# Patient Record
Sex: Female | Born: 1975 | Race: Black or African American | Hispanic: No | Marital: Married | Smoking: Never smoker
Health system: Southern US, Community
[De-identification: ages and names within clinical notes are randomized; demographics above are authoritative.]

## PROBLEM LIST (undated history)

## (undated) DIAGNOSIS — B019 Varicella without complication: Secondary | ICD-10-CM

## (undated) DIAGNOSIS — Z9989 Dependence on other enabling machines and devices: Principal | ICD-10-CM

## (undated) DIAGNOSIS — R000 Tachycardia, unspecified: Secondary | ICD-10-CM

## (undated) DIAGNOSIS — I509 Heart failure, unspecified: Secondary | ICD-10-CM

## (undated) DIAGNOSIS — F39 Unspecified mood [affective] disorder: Secondary | ICD-10-CM

## (undated) DIAGNOSIS — M797 Fibromyalgia: Secondary | ICD-10-CM

## (undated) DIAGNOSIS — E119 Type 2 diabetes mellitus without complications: Secondary | ICD-10-CM

## (undated) DIAGNOSIS — E282 Polycystic ovarian syndrome: Secondary | ICD-10-CM

## (undated) DIAGNOSIS — G4733 Obstructive sleep apnea (adult) (pediatric): Secondary | ICD-10-CM

## (undated) DIAGNOSIS — E039 Hypothyroidism, unspecified: Secondary | ICD-10-CM

## (undated) DIAGNOSIS — F419 Anxiety disorder, unspecified: Secondary | ICD-10-CM

## (undated) HISTORY — DX: Obstructive sleep apnea (adult) (pediatric): G47.33

## (undated) HISTORY — DX: Type 2 diabetes mellitus without complications: E11.9

## (undated) HISTORY — DX: Dependence on other enabling machines and devices: Z99.89

## (undated) HISTORY — DX: Varicella without complication: B01.9

## (undated) HISTORY — DX: Fibromyalgia: M79.7

## (undated) HISTORY — DX: Unspecified mood (affective) disorder: F39

## (undated) HISTORY — DX: Polycystic ovarian syndrome: E28.2

## (undated) HISTORY — DX: Tachycardia, unspecified: R00.0

## (undated) HISTORY — PX: BREAST REDUCTION SURGERY: SHX8

## (undated) HISTORY — PX: WISDOM TOOTH EXTRACTION: SHX21

---

## 1999-08-22 ENCOUNTER — Encounter: Admission: RE | Admit: 1999-08-22 | Discharge: 1999-08-22 | Payer: Self-pay | Admitting: Family Medicine

## 1999-08-22 ENCOUNTER — Encounter: Payer: Self-pay | Admitting: Family Medicine

## 2001-01-06 ENCOUNTER — Encounter: Payer: Self-pay | Admitting: Family Medicine

## 2001-01-06 ENCOUNTER — Encounter: Admission: RE | Admit: 2001-01-06 | Discharge: 2001-01-06 | Payer: Self-pay | Admitting: Family Medicine

## 2003-07-18 ENCOUNTER — Emergency Department (HOSPITAL_COMMUNITY): Admission: EM | Admit: 2003-07-18 | Discharge: 2003-07-18 | Payer: Self-pay | Admitting: Emergency Medicine

## 2004-10-07 ENCOUNTER — Encounter: Admission: RE | Admit: 2004-10-07 | Discharge: 2004-10-07 | Payer: Self-pay | Admitting: Neurology

## 2011-12-23 ENCOUNTER — Encounter: Payer: Self-pay | Admitting: Family Medicine

## 2011-12-23 ENCOUNTER — Ambulatory Visit (INDEPENDENT_AMBULATORY_CARE_PROVIDER_SITE_OTHER): Payer: 59 | Admitting: Family Medicine

## 2011-12-23 VITALS — BP 125/75 | HR 95 | Temp 98.1°F | Ht 63.75 in | Wt 202.0 lb

## 2011-12-23 DIAGNOSIS — R5383 Other fatigue: Secondary | ICD-10-CM

## 2011-12-23 DIAGNOSIS — E282 Polycystic ovarian syndrome: Secondary | ICD-10-CM

## 2011-12-23 DIAGNOSIS — R5381 Other malaise: Secondary | ICD-10-CM

## 2011-12-23 DIAGNOSIS — H6980 Other specified disorders of Eustachian tube, unspecified ear: Secondary | ICD-10-CM | POA: Insufficient documentation

## 2011-12-23 LAB — CBC WITH DIFFERENTIAL/PLATELET
Basophils Absolute: 0 10*3/uL (ref 0.0–0.1)
Basophils Relative: 0.7 % (ref 0.0–3.0)
Eosinophils Absolute: 0.1 10*3/uL (ref 0.0–0.7)
Eosinophils Relative: 1.1 % (ref 0.0–5.0)
HCT: 46.5 % — ABNORMAL HIGH (ref 36.0–46.0)
Hemoglobin: 15.7 g/dL — ABNORMAL HIGH (ref 12.0–15.0)
Lymphocytes Relative: 40.8 % (ref 12.0–46.0)
Lymphs Abs: 2.4 10*3/uL (ref 0.7–4.0)
MCHC: 33.8 g/dL (ref 30.0–36.0)
MCV: 88 fl (ref 78.0–100.0)
Monocytes Absolute: 0.5 10*3/uL (ref 0.1–1.0)
Monocytes Relative: 7.7 % (ref 3.0–12.0)
Neutro Abs: 3 10*3/uL (ref 1.4–7.7)
Neutrophils Relative %: 49.7 % (ref 43.0–77.0)
Platelets: 462 10*3/uL — ABNORMAL HIGH (ref 150.0–400.0)
RBC: 5.28 Mil/uL — ABNORMAL HIGH (ref 3.87–5.11)
RDW: 13.4 % (ref 11.5–14.6)
WBC: 6 10*3/uL (ref 4.5–10.5)

## 2011-12-23 LAB — BASIC METABOLIC PANEL
BUN: 11 mg/dL (ref 6–23)
CO2: 20 mEq/L (ref 19–32)
Calcium: 9.4 mg/dL (ref 8.4–10.5)
Chloride: 102 mEq/L (ref 96–112)
Creatinine, Ser: 0.4 mg/dL (ref 0.4–1.2)
GFR: 181.72 mL/min (ref >60.00)
Glucose, Bld: 309 mg/dL — ABNORMAL HIGH (ref 70–99)
Potassium: 4.2 mEq/L (ref 3.5–5.1)
Sodium: 133 mEq/L — ABNORMAL LOW (ref 135–145)

## 2011-12-23 LAB — TSH: TSH: 2.53 u[IU]/mL (ref 0.35–5.50)

## 2011-12-23 MED ORDER — FLUTICASONE PROPIONATE 50 MCG/ACT NA SUSP: 2.0000 | Freq: Every day | NASAL | Status: DC

## 2011-12-23 NOTE — Assessment & Plan Note (Signed)
New.  Pt would like thyroid checked to r/o possible abnormality.  Will also r/o anemia and electrolyte disturbance.  Will follow closely.

## 2011-12-23 NOTE — Assessment & Plan Note (Signed)
New.  Pt's ear pain is not due to infxn but rather untreated nasal allergies.  Start nasal steroid spray and encouraged daily OTC antihistamine use.  Pt expressed understanding and is in agreement w/ plan.

## 2011-12-23 NOTE — Patient Instructions (Addendum)
Schedule your complete medical physical in 6 months (Julio Sicks will take care of the GYN stuff) We'll notify you of your lab results and make any changes if needed Start the nasal spray- 2 sprays each nostril daily- to improve your ear pain caused by your nasal allergies Start taking Claritin/Zyrtec/Allegra daily Call with any questions or concerns Welcome!  We're glad to have you!

## 2011-12-23 NOTE — Progress Notes (Signed)
  Subjective:    Patient ID: Lydia Taylor, female    DOB: 1975/10/23, 36 y.o.   MRN: [id number]  HPI New to establish.  Previous PCP- none.  GYN- Julio Sicks, NP ([facility])  PCOS- chronic problem, currently on Metformin.  Following w/ GYN.  Pt has been attributing weight gain, hot flashes to this dx.  Hot flashes, weight gain, fatigue despite adequate sleep (8-9 hrs) but describes sleep as 'restless' due to hot flashes.  Husband concerned about possible thyroid issue.  No family hx of thyroid problems.  Denies palpitations, N/V/D.  Ear pain- R ear, sxs started 'several months ago'.  Will be intermittently painful.  No drainage.  No fevers.  No hx of recurrent ear infections, tubes.  + nasal congestion, PND.  Taking Allegra prn and benadryl for sleep.   Review of Systems For ROS see HPI     Objective:   Physical Exam  Vitals reviewed. Constitutional: She is oriented to person, place, and time. She appears well-developed and well-nourished. No distress.       obese  HENT:  Head: Normocephalic and atraumatic.  Right Ear: Tympanic membrane normal.  Left Ear: Tympanic membrane normal.  Nose: Mucosal edema and rhinorrhea present. Right sinus exhibits no maxillary sinus tenderness and no frontal sinus tenderness. Left sinus exhibits no maxillary sinus tenderness and no frontal sinus tenderness.  Mouth/Throat: Mucous membranes are normal. Posterior oropharyngeal erythema (w/ PND) present.  Eyes: Conjunctivae and EOM are normal. Pupils are equal, round, and reactive to light.  Neck: Normal range of motion. Neck supple. No thyromegaly present.  Cardiovascular: Normal rate, regular rhythm and normal heart sounds.   Pulmonary/Chest: Effort normal and breath sounds normal. No respiratory distress. She has no wheezes. She has no rales.  Musculoskeletal: She exhibits no edema.  Lymphadenopathy:    She has no cervical adenopathy.  Neurological: She is alert and oriented to person,  place, and time. No cranial nerve deficit. Coordination normal.  Skin: Skin is warm and dry.  Psychiatric: She has a normal mood and affect. Her behavior is normal.          Assessment & Plan:

## 2011-12-23 NOTE — Assessment & Plan Note (Signed)
New to provider, chronic for pt.  On Metformin.  Will follow along and assist as able.

## 2011-12-24 LAB — HEMOGLOBIN A1C: Hgb A1c MFr Bld: 10 % — ABNORMAL HIGH (ref 4.6–6.5)

## 2012-01-02 ENCOUNTER — Encounter: Payer: Self-pay | Admitting: *Deleted

## 2012-01-02 MED ORDER — METFORMIN HCL 500 MG PO TABS: 1000.0000 mg | ORAL_TABLET | Freq: Two times a day (BID) | ORAL | Status: DC

## 2012-01-02 NOTE — Addendum Note (Signed)
Addended by: Derry Lory A on: 01/02/2012 04:34 PM   Modules accepted: Orders

## 2012-01-07 ENCOUNTER — Ambulatory Visit: Payer: 59 | Admitting: Family Medicine

## 2012-01-08 ENCOUNTER — Telehealth: Payer: Self-pay | Admitting: Family Medicine

## 2012-01-08 NOTE — Telephone Encounter (Signed)
Message copied by Verner Chol on Wed Jan 08, 2012  1:06 PM ------      Message from: Sheliah Hatch      Created: Tue Jan 07, 2012  9:01 AM      Regarding: RE: CANCELLATION       Absolutely a $50 fee!  She scheduled the appt- she needs to make sure she's here!      ----- Message -----         From: Duaine Dredge         Sent: 01/07/2012   8:29 AM           To: Sheliah Hatch, MD, #      Subject: CANCELLATION                                             Your 9:15am patient just cancelled.  Her reason is "there is just no way I can get there by 9:15am"  Did not want to rsc'd at this time.  If want to charge $50 fee please inform Judeth Cornfield.

## 2013-12-17 DIAGNOSIS — G43909 Migraine, unspecified, not intractable, without status migrainosus: Secondary | ICD-10-CM | POA: Insufficient documentation

## 2014-05-23 DIAGNOSIS — E669 Obesity, unspecified: Secondary | ICD-10-CM | POA: Insufficient documentation

## 2014-05-23 DIAGNOSIS — E1169 Type 2 diabetes mellitus with other specified complication: Secondary | ICD-10-CM | POA: Insufficient documentation

## 2014-05-23 DIAGNOSIS — E66811 Obesity, class 1: Secondary | ICD-10-CM | POA: Insufficient documentation

## 2014-10-24 LAB — HM PAP SMEAR: HM Pap smear: NORMAL

## 2014-12-22 LAB — HM DIABETES EYE EXAM

## 2015-03-02 ENCOUNTER — Ambulatory Visit: Payer: Self-pay | Admitting: Licensed Clinical Social Worker

## 2015-05-22 ENCOUNTER — Ambulatory Visit: Payer: Self-pay | Admitting: Physician Assistant

## 2015-05-31 ENCOUNTER — Telehealth: Payer: Self-pay | Admitting: Physician Assistant

## 2015-05-31 ENCOUNTER — Ambulatory Visit (INDEPENDENT_AMBULATORY_CARE_PROVIDER_SITE_OTHER): Payer: Commercial Managed Care - PPO | Admitting: Physician Assistant

## 2015-05-31 ENCOUNTER — Encounter: Payer: Self-pay | Admitting: Physician Assistant

## 2015-05-31 VITALS — BP 127/94 | HR 93 | Temp 98.2°F | Ht 63.75 in | Wt 187.4 lb

## 2015-05-31 DIAGNOSIS — M791 Myalgia: Secondary | ICD-10-CM | POA: Diagnosis not present

## 2015-05-31 DIAGNOSIS — IMO0001 Reserved for inherently not codable concepts without codable children: Secondary | ICD-10-CM

## 2015-05-31 DIAGNOSIS — M25512 Pain in left shoulder: Secondary | ICD-10-CM | POA: Diagnosis not present

## 2015-05-31 DIAGNOSIS — M609 Myositis, unspecified: Secondary | ICD-10-CM | POA: Diagnosis not present

## 2015-05-31 LAB — CBC
HEMATOCRIT: 46.2 % — AB (ref 36.0–46.0)
Hemoglobin: 15.4 g/dL — ABNORMAL HIGH (ref 12.0–15.0)
MCHC: 33.4 g/dL (ref 30.0–36.0)
MCV: 86.9 fl (ref 78.0–100.0)
Platelets: 473 10*3/uL — ABNORMAL HIGH (ref 150.0–400.0)
RBC: 5.31 Mil/uL — ABNORMAL HIGH (ref 3.87–5.11)
RDW: 13.6 % (ref 11.5–15.5)
WBC: 6.7 10*3/uL (ref 4.0–10.5)

## 2015-05-31 LAB — SEDIMENTATION RATE: SED RATE: 15 mm/h (ref 0–22)

## 2015-05-31 LAB — RHEUMATOID FACTOR: Rhuematoid fact SerPl-aCnc: <10 IU/mL (ref <=14)

## 2015-05-31 MED ORDER — CELECOXIB 100 MG PO CAPS: 100.0000 mg | ORAL_CAPSULE | Freq: Two times a day (BID) | ORAL | Status: DC

## 2015-05-31 NOTE — Patient Instructions (Signed)
Please go to the lab for blood work. I will call with your results. You will be contacted by Dr. Lazaro Arms office for further assessment of your shoulder. Take the Celebrex twice daily with food.  We will alter your regimen based on your results.  Follow-up in 2-3 weeks.

## 2015-05-31 NOTE — Progress Notes (Signed)
Patient presents to clinic today to re-establish care.  Patient is followed by GYN for PCOS and routine gynecological care -- sees [facility]. Is also followed by Endocrinology for DM and Hypothyroidism.  Patient c/o pain in left shoulder over the past several months. Was seen by Chiropractor who felt symptoms were consistent with bursitis. Injections were recommended but patient has previously declined. Denies having any imaging done of her left shoulder. Endorses pain is constant but worsened with abduction of the arm. Denies inability to raise arm but notes pain. Denies numbness or tingling. Denies pain in the neck.  Patient also endorses pain of bilateral knees and hips over the past few months that is constant and worsening. Denies trauma or injury. Denies swelling of joints. Was told before that she may have fibromyalgia but no workup was done to assess for arthritis or inflammatory conditions.  Past Medical History  Diagnosis Date  . PCOS (polycystic ovarian syndrome)   . Diabetes mellitus without complication ([place])   . Chicken pox     Past Surgical History  Procedure Laterality Date  . Breast reduction surgery    . Wisdom tooth extraction      No current outpatient prescriptions on file prior to visit.   No current facility-administered medications on file prior to visit.    Allergies  Allergen Reactions  . Amoxicillin Rash    Family History  Problem Relation Age of Onset  . Thyroid disease Mother   . Diabetes Father   . Hypertension Father   . Hyperlipidemia Father     Social History   Social History  . Marital Status: Married    Spouse Name: N/A  . Number of Children: N/A  . Years of Education: N/A   Occupational History  . Adult nurse    Social History Main Topics  . Smoking status: Never Smoker   . Smokeless tobacco: Never Used  . Alcohol Use: 0.0 oz/week    0 Standard drinks or equivalent per week     Comment: socially  . Drug  Use: No  . Sexual Activity:    Partners: Male   Other Topics Concern  . Not on file   Social History Narrative   Review of Systems  Constitutional: Negative for fever and weight loss.  HENT: Negative for ear discharge, ear pain, hearing loss and tinnitus.   Eyes: Negative for blurred vision, double vision, photophobia and pain.  Respiratory: Negative for cough and shortness of breath.   Cardiovascular: Negative for chest pain and palpitations.  Gastrointestinal: Negative for heartburn, nausea, vomiting, abdominal pain, diarrhea, constipation, blood in stool and melena.  Genitourinary: Negative for dysuria, urgency, frequency, hematuria and flank pain.  Musculoskeletal: Positive for myalgias and joint pain. Negative for falls.  Neurological: Negative for dizziness, loss of consciousness and headaches.  Endo/Heme/Allergies: Negative for environmental allergies.  Psychiatric/Behavioral: Negative for depression, suicidal ideas, hallucinations and substance abuse. The patient is not nervous/anxious and does not have insomnia.    BP 127/94 mmHg  Pulse 93  Temp(Src) 98.2 F (36.8 C) (Oral)  Ht 5' 3.75" (1.619 m)  Wt 187 lb 6.4 oz (85.004 kg)  BMI 32.43 kg/m2  SpO2 99%  LMP 05/27/2015  Physical Exam  Constitutional: She is oriented to person, place, and time and well-developed, well-nourished, and in no distress.  HENT:  Head: Normocephalic and atraumatic.  Eyes: Conjunctivae are normal.  Cardiovascular: Normal rate, regular rhythm, normal heart sounds and intact distal pulses.   Pulmonary/Chest: Effort normal  and breath sounds normal. No respiratory distress. She has no wheezes. She has no rales. She exhibits no tenderness.  Musculoskeletal:       Left shoulder: She exhibits tenderness and pain. She exhibits normal range of motion, no spasm, normal pulse and normal strength.       Right knee: Normal.       Left knee: Normal.       Right lower leg: She exhibits tenderness. She  exhibits no swelling.       Left lower leg: She exhibits tenderness. She exhibits no swelling.  Neurological: She is alert and oriented to person, place, and time.  Skin: Skin is warm and dry. No rash noted.  Psychiatric: Affect normal.  Vitals reviewed.   No results found for this or any previous visit (from the past 2160 hour(s)).  Assessment/Plan: Left shoulder pain AROM and PROM present but with pain. Favor tendinopathy versus bursitis. Will begin Celebrex. Supportive measures reviewed. Referral to Sports Medicine placed.  Myalgia and myositis Unclear etiology. Will check CBC, ESR, ANA and RF today. Will begin low intensity exercise and celebrex. Follow-up 3-4 weeks.

## 2015-05-31 NOTE — Telephone Encounter (Signed)
Pharmacy changed.//AB/CMA 

## 2015-05-31 NOTE — Assessment & Plan Note (Signed)
AROM and PROM present but with pain. Favor tendinopathy versus bursitis. Will begin Celebrex. Supportive measures reviewed. Referral to Sports Medicine placed.

## 2015-05-31 NOTE — Assessment & Plan Note (Signed)
Unclear etiology. Will check CBC, ESR, ANA and RF today. Will begin low intensity exercise and celebrex. Follow-up 3-4 weeks.

## 2015-05-31 NOTE — Progress Notes (Signed)
Pre visit review using our clinic review tool, if applicable. No additional management support is needed unless otherwise documented below in the visit note. 

## 2015-05-31 NOTE — Telephone Encounter (Signed)
Pt would like to change the pharmacy on file to [facility] on [address]

## 2015-06-01 LAB — ANA: Anti Nuclear Antibody(ANA): NEGATIVE

## 2015-06-05 ENCOUNTER — Telehealth: Payer: Self-pay | Admitting: *Deleted

## 2015-06-05 DIAGNOSIS — M25512 Pain in left shoulder: Secondary | ICD-10-CM

## 2015-06-05 NOTE — Telephone Encounter (Signed)
-----   Message from Waldon Merl, PA-C sent at 06/05/2015  7:13 AM EST ----- Labs are unremarkable. No sign of inflammation. Suspect pain is osteoarthritis coupled with some fibromyalgia. Would like to see her as scheduled for follow-up so we can see how the Celebrex is doing and discuss further management.

## 2015-06-05 NOTE — Telephone Encounter (Signed)
Pt was advised that someone would contact her in the next few days with sports medicine referral and she already has f/u on 06/19/15.

## 2015-06-05 NOTE — Telephone Encounter (Signed)
Referral has been placed. Have her schedule follow-up so we can discuss other medication options for chronic msk pain.

## 2015-06-05 NOTE — Telephone Encounter (Signed)
Notified pt and she voices understanding. States we were supposed to have referred her to Sports Medicine. Doesn't really feel like celebrex is helping. Please advise?

## 2015-06-08 ENCOUNTER — Ambulatory Visit: Payer: Commercial Managed Care - PPO | Admitting: Family Medicine

## 2015-06-13 ENCOUNTER — Ambulatory Visit (INDEPENDENT_AMBULATORY_CARE_PROVIDER_SITE_OTHER): Payer: Commercial Managed Care - PPO | Admitting: Family Medicine

## 2015-06-13 ENCOUNTER — Encounter: Payer: Self-pay | Admitting: Family Medicine

## 2015-06-13 VITALS — BP 127/82 | HR 112 | Ht 63.0 in | Wt 187.0 lb

## 2015-06-13 DIAGNOSIS — M25552 Pain in left hip: Secondary | ICD-10-CM

## 2015-06-13 DIAGNOSIS — M25512 Pain in left shoulder: Secondary | ICD-10-CM | POA: Diagnosis not present

## 2015-06-13 MED ORDER — METHYLPREDNISOLONE ACETATE 40 MG/ML IJ SUSP
40.0000 mg | Freq: Once | INTRAMUSCULAR | Status: AC
  Administered 2015-06-13: 40 mg via INTRA_ARTICULAR

## 2015-06-13 NOTE — Patient Instructions (Signed)
You have a frozen shoulder (adhesive capsulitis), a buildup of scar tissue that limits motion of the shoulder joint. Limit lifting and overhead activities as much as possible. Heat 15 minutes at a time 3-4 times a day may help with movement and stiffness. Tylenol and/or aleve as needed for pain and inflammation. Steroid injections in a series have been shown to help with pain and motion - you were given this today. Codman exercises (pendulum, table slides, arm circles) - do 3 sets of 10 once or twice a day. Physical therapy for rotator cuff strengthening is a consideration once you are out of the painful phase You can start the theraband exercises  Follow up in 6 weeks  You have trochanteric bursitis. Avoid painful activities as much as possible. Ice over area of pain 3-4 times a day for 15 minutes at a time Hip side raise exercise 3 sets of 10-15 once a day - add weights if this becomes too easy. Stretches - pick 2 and hold for 20-30 seconds x 3 - do once or twice a day. Tylenol and/or aleve as needed for pain. If not improving, can consider physical therapy. You were given a cortisone shot today.

## 2015-06-14 DIAGNOSIS — M25552 Pain in left hip: Secondary | ICD-10-CM | POA: Insufficient documentation

## 2015-06-14 NOTE — Assessment & Plan Note (Signed)
Left trochanteric bursitis - Shown home exercises and stretches to do daily.  Injection given as well.  Tylenol, nsaids if needed.  Icing.  F/u in 6 weeks.  Consider physical therapy if not improving.  After informed written consent patient was lying on right side on exam table.  Area overlying left trochanteric bursa prepped with alcohol swab then injected with 6:2 marcaine: depomedrol.  Patient tolerated procedure well without immediate complications.

## 2015-06-14 NOTE — Progress Notes (Signed)
PCP and consultation requested by: Piedad Climes, PA-C  Subjective:   HPI: Patient is a 40 y.o. female here for left shoulder, left hip pain.  Patient reports for 3 1/2 months she's had lateral left shoulder pain. Some right shoulder pain too but left is worse at 8/10 level, sharp. Worse lying on left side, reaching overhead. Tried celebrex without much help. Left hip also bothering for 3 1/2 months. No injury or trauma. Pain level 0/10 currently. Worse lying on her side. No skin changes, fever, other complaints.  Past Medical History  Diagnosis Date  . PCOS (polycystic ovarian syndrome)   . Diabetes mellitus without complication ([facility])   . Chicken pox     Current Outpatient Prescriptions on File Prior to Visit  Medication Sig Dispense Refill  . canagliflozin (INVOKANA) 100 MG TABS tablet Take 1 tablet by mouth daily.    . celecoxib (CELEBREX) 100 MG capsule Take 1 capsule (100 mg total) by mouth 2 (two) times daily. 60 capsule 1  . diphenhydrAMINE (SOMINEX) 25 MG tablet Take 25 mg by mouth as needed.    . Dulaglutide (TRULICITY) 1.5 MG/0.5ML SOPN Inject 1.5 mg into the skin once a week.    Marland Kitchen glucose blood (FREESTYLE INSULINX TEST) test strip Use as directed    . levothyroxine (SYNTHROID, LEVOTHROID) 25 MCG tablet Take 25 mcg by mouth daily before breakfast.    . metFORMIN (GLUCOPHAGE) 1000 MG tablet Take 1 tablet by mouth 2 (two) times daily.    . Multiple Vitamins-Minerals (THERA-M) TABS Take 1 tablet by mouth daily.    . norethindrone (MICRONOR,CAMILA,ERRIN) 0.35 MG tablet Take 1 tablet by mouth daily.    . sertraline (ZOLOFT) 50 MG tablet Take 50 mg by mouth daily.    . Topiramate ER (TROKENDI XR) 100 MG CP24 Take 100 mg by mouth daily.     No current facility-administered medications on file prior to visit.    Past Surgical History  Procedure Laterality Date  . Breast reduction surgery    . Wisdom tooth extraction      Allergies  Allergen Reactions  .  Amoxicillin Rash    Social History   Social History  . Marital Status: Married    Spouse Name: N/A  . Number of Children: N/A  . Years of Education: N/A   Occupational History  . Theatre manager    Social History Main Topics  . Smoking status: Never Smoker   . Smokeless tobacco: Never Used  . Alcohol Use: 0.0 oz/week    0 Standard drinks or equivalent per week     Comment: socially  . Drug Use: No  . Sexual Activity:    Partners: Male   Other Topics Concern  . Not on file   Social History Narrative    Family History  Problem Relation Age of Onset  . Thyroid disease Mother   . Diabetes Father   . Hypertension Father   . Hyperlipidemia Father     BP 127/82 mmHg  Pulse 112  Ht 5\' 3"  (1.6 m)  Wt 187 lb (84.823 kg)  BMI 33.13 kg/m2  LMP 05/27/2015  Review of Systems: See HPI above.    Objective:  Physical Exam:  Gen: NAD  Left shoulder: No swelling, ecchymoses.  No gross deformity. No TTP. ER 30 degrees.  Abduction and flexion to 90 degrees. Positive Hawkins, Neers. Negative Yergasons. Strength 5/5 with empty can and resisted internal/external rotation. NV intact distally.  Right shoulder: FROM without pain.  Left  hip: No swelling, bruising, other complaints. Hip abduction 4/5 with pain. TTP trochanteric bursa only.  No other tenderness. Negative logroll, fabers, piriformis stretches.    Assessment & Plan:  1. 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  Discussed options.  Shown home exercises to do daily.  Heat, tylenol/nsaids.  Combination injection given today.  F/u in 6 weeks.  After informed written consent, patient was seated on exam table. Left shoulder was prepped with alcohol swab and utilizing posterior approach, patient's left shoulder was injected with 6:2 marcaine:depomedrol with half in the subacromial space and half in glenohumeral space.  Patient tolerated the procedure well without immediate complications.  2. Left trochanteric  bursitis - Shown home exercises and stretches to do daily.  Injection given as well.  Tylenol, nsaids if needed.  Icing.  F/u in 6 weeks.  Consider physical therapy if not improving.  After informed written consent patient was lying on right side on exam table.  Area overlying left trochanteric bursa prepped with alcohol swab then injected with 6:2 marcaine: depomedrol.  Patient tolerated procedure well without immediate complications.

## 2015-06-14 NOTE — Assessment & Plan Note (Signed)
2/2 frozen shoulder.  Discussed options.  Shown home exercises to do daily.  Heat, tylenol/nsaids.  Combination injection given today.  F/u in 6 weeks.  After informed written consent, patient was seated on exam table. Left shoulder was prepped with alcohol swab and utilizing posterior approach, patient's left shoulder was injected with 6:2 marcaine:depomedrol with half in the subacromial space and half in glenohumeral space.  Patient tolerated the procedure well without immediate complications.

## 2015-06-19 ENCOUNTER — Ambulatory Visit (INDEPENDENT_AMBULATORY_CARE_PROVIDER_SITE_OTHER): Payer: Commercial Managed Care - PPO | Admitting: Physician Assistant

## 2015-06-19 ENCOUNTER — Encounter: Payer: Self-pay | Admitting: Physician Assistant

## 2015-06-19 VITALS — BP 121/92 | HR 111 | Temp 98.1°F | Ht 63.0 in | Wt 180.6 lb

## 2015-06-19 DIAGNOSIS — M25512 Pain in left shoulder: Secondary | ICD-10-CM | POA: Diagnosis not present

## 2015-06-19 DIAGNOSIS — E039 Hypothyroidism, unspecified: Secondary | ICD-10-CM

## 2015-06-19 LAB — TSH: TSH: 2.39 u[IU]/mL (ref 0.35–4.50)

## 2015-06-19 MED ORDER — HYDROCODONE-ACETAMINOPHEN 10-325 MG PO TABS: 1.0000 | ORAL_TABLET | Freq: Three times a day (TID) | ORAL | Status: DC | PRN

## 2015-06-19 NOTE — Assessment & Plan Note (Signed)
Will stop Celebrex. Rx hydrocodone to use as directed. Follow-up with Sports Medicine and PT as scheduled.

## 2015-06-19 NOTE — Assessment & Plan Note (Signed)
Tachycardic today. Endorses taking medications as directed. Will check TSH today.

## 2015-06-19 NOTE — Progress Notes (Signed)
Pre visit review using our clinic review tool, if applicable. No additional management support is needed unless otherwise documented below in the visit note. 

## 2015-06-19 NOTE — Patient Instructions (Signed)
Please take pain medication as directed. Schedule a follow-up appointment with Dr. Pearletha Forge for your shoulder. If pain is not controlled enough with medication to allow sleep, please let me know so we can get a medicine started short-term for sleep.  Please go to the lab so we can reassess your thyroid function.

## 2015-06-19 NOTE — Progress Notes (Signed)
Patient presents to clinic today to follow-up on myalgias and arthralgias. Has seen Sports Medicine and was diagnosed with trochanteric bursitis and frozen shoulder (left). Was giving steroid injections. Endorses hip pain is completely resolved. Still having bilateral shoulder pain. Denies improvement with Celebrex. Previous lab workup unremarkable. Pain is affecting sleep. Patient has follow-up scheduled with Sports Medicine for second injection and PT.   Past Medical History  Diagnosis Date  . PCOS (polycystic ovarian syndrome)   . Diabetes mellitus without complication ([place])   . Chicken pox     Current Outpatient Prescriptions on File Prior to Visit  Medication Sig Dispense Refill  . canagliflozin (INVOKANA) 100 MG TABS tablet Take 1 tablet by mouth daily.    . diphenhydrAMINE (SOMINEX) 25 MG tablet Take 25 mg by mouth as needed.    . Dulaglutide (TRULICITY) 1.5 KM/6.2MM SOPN Inject 1.5 mg into the skin once a week.    Marland Kitchen glucose blood (FREESTYLE INSULINX TEST) test strip Use as directed    . levothyroxine (SYNTHROID, LEVOTHROID) 25 MCG tablet Take 25 mcg by mouth daily before breakfast.    . metFORMIN (GLUCOPHAGE) 1000 MG tablet Take 1 tablet by mouth 2 (two) times daily.    . Multiple Vitamins-Minerals (THERA-M) TABS Take 1 tablet by mouth daily.    . norethindrone (MICRONOR,CAMILA,ERRIN) 0.35 MG tablet Take 1 tablet by mouth daily.    . sertraline (ZOLOFT) 50 MG tablet Take 50 mg by mouth daily.    . Topiramate ER (TROKENDI XR) 100 MG CP24 Take 100 mg by mouth daily.     No current facility-administered medications on file prior to visit.    Allergies  Allergen Reactions  . Amoxicillin Rash    Family History  Problem Relation Age of Onset  . Thyroid disease Mother   . Diabetes Father   . Hypertension Father   . Hyperlipidemia Father     Social History   Social History  . Marital Status: Married    Spouse Name: N/A  . Number of Children: N/A  . Years of Education:  N/A   Occupational History  . Adult nurse    Social History Main Topics  . Smoking status: Never Smoker   . Smokeless tobacco: Never Used  . Alcohol Use: 0.0 oz/week    0 Standard drinks or equivalent per week     Comment: socially  . Drug Use: No  . Sexual Activity:    Partners: Male   Other Topics Concern  . None   Social History Narrative   Review of Systems - See HPI.  All other ROS are negative.  BP 121/92 mmHg  Pulse 111  Temp(Src) 98.1 F (36.7 C) (Oral)  Ht _0  (1.6 m)  Wt 180 lb 9.6 oz (81.92 kg)  BMI 32.00 kg/m2  SpO2 100%  LMP 05/27/2015  Physical Exam  Constitutional: She is oriented to person, place, and time and well-developed, well-nourished, and in no distress.  HENT:  Head: Normocephalic and atraumatic.  Eyes: Conjunctivae are normal.  Cardiovascular: Regular rhythm, normal heart sounds and intact distal pulses.   Mildly tachycardic  Pulmonary/Chest: Effort normal and breath sounds normal. No respiratory distress. She has no wheezes. She has no rales. She exhibits no tenderness.  Neurological: She is alert and oriented to person, place, and time.  Skin: Skin is warm and dry. No rash noted.  Vitals reviewed.   Recent Results (from the past 2160 hour(s))  CBC     Status: Abnormal  Collection Time: 05/31/15  9:33 AM  Result Value Ref Range   WBC 6.7 4.0 - 10.5 K/uL   RBC 5.31 (H) 3.87 - 5.11 Mil/uL   Platelets 473.0 (H) 150.0 - 400.0 K/uL   Hemoglobin 15.4 (H) 12.0 - 15.0 g/dL   HCT 46.2 (H) 36.0 - 46.0 %   MCV 86.9 78.0 - 100.0 fl   MCHC 33.4 30.0 - 36.0 g/dL   RDW 13.6 11.5 - 15.5 %  Sed Rate (ESR)     Status: None   Collection Time: 05/31/15  9:33 AM  Result Value Ref Range   Sed Rate 15 0 - 22 mm/hr  Rheumatoid Factor     Status: None   Collection Time: 05/31/15  9:33 AM  Result Value Ref Range   Rhuematoid fact SerPl-aCnc <10 <=14 IU/mL    Comment:                            Interpretive Table                     Low  Positive: 15 - 41 IU/mL                     High Positive:  >= 42 IU/mL    In addition to the RF result, and clinical symptoms including joint  involvement, the 2010 ACR Classification Criteria for  scoring/diagnosing Rheumatoid Arthritis include the results of the  following tests:  CRP (09106), ESR (15010), and CCP (APCA) (81661).  www.rheumatology.org/practice/clinical/classification/ra/ra_2010.asp   Antinuclear Antib (ANA)     Status: None   Collection Time: 05/31/15  9:33 AM  Result Value Ref Range   Anit Nuclear Antibody(ANA) NEG NEGATIVE    Assessment/Plan: Left shoulder pain Will stop Celebrex. Rx hydrocodone to use as directed. Follow-up with Sports Medicine and PT as scheduled.   Thyroid activity decreased Tachycardic today. Endorses taking medications as directed. Will check TSH today.

## 2015-06-20 ENCOUNTER — Encounter: Payer: Self-pay | Admitting: *Deleted

## 2015-06-28 ENCOUNTER — Encounter: Payer: Self-pay | Admitting: Physician Assistant

## 2015-06-28 NOTE — Telephone Encounter (Signed)
LMOM with contact name and number [for return call if needed] RE: FMLA paperwork has been located in outgoing faxed papers, as I mistakenly thought that the paperwork had been faxed by provider; I apologized for the miscommunication and informed patient that if there were any further questions and/or concerns to please call or have her employer call/SLS 02/15 Copy to My Chart Message.

## 2015-07-25 ENCOUNTER — Ambulatory Visit: Payer: Commercial Managed Care - PPO | Admitting: Family Medicine

## 2015-08-08 ENCOUNTER — Encounter: Payer: Self-pay | Admitting: Family Medicine

## 2015-08-08 ENCOUNTER — Ambulatory Visit (INDEPENDENT_AMBULATORY_CARE_PROVIDER_SITE_OTHER): Payer: Commercial Managed Care - PPO | Admitting: Family Medicine

## 2015-08-08 VITALS — BP 117/84 | HR 96 | Ht 63.0 in | Wt 184.0 lb

## 2015-08-08 DIAGNOSIS — M25512 Pain in left shoulder: Secondary | ICD-10-CM | POA: Diagnosis not present

## 2015-08-08 MED ORDER — METHYLPREDNISOLONE ACETATE 40 MG/ML IJ SUSP
40.0000 mg | Freq: Once | INTRAMUSCULAR | Status: AC
  Administered 2015-08-08: 40 mg via INTRA_ARTICULAR

## 2015-08-08 NOTE — Patient Instructions (Signed)
Continue with your home exercises. We repeated a cortisone shot today - we could do a third one if necessary in 4-6 weeks. Let me know if you want to do physical therapy. Follow up with me in 4-6 weeks if you want to do another injection. Limit lifting and overhead activities as much as possible. Heat 15 minutes at a time 3-4 times a day may help with movement and stiffness. Tylenol and/or aleve as needed for pain and inflammation.

## 2015-08-09 NOTE — Assessment & Plan Note (Signed)
2/2 frozen shoulder.  Discussed options - went ahead with repeat intraarticular injection today.  Heat, tylenol/nsaids.  Continue with home exercises.  Consider physical therapy, third intraarticular injection if not improving.  F/u in 4-6 weeks.  After informed written consent, patient was seated on exam table. Left shoulder was prepped with alcohol swab and utilizing posterior approach, patient's left glenohumeral space was injected with 3:1 marcaine: depomedrol. Patient tolerated the procedure well without immediate complications.

## 2015-08-09 NOTE — Progress Notes (Signed)
PCP and consultation requested by: Lydia Climes, PA-C  Subjective:   HPI: Patient is a 40 y.o. female here for left shoulder, left hip pain.  1/31: Patient reports for 3 1/2 months she's had lateral left shoulder pain. Some right shoulder pain too but left is worse at 8/10 level, sharp. Worse lying on left side, reaching overhead. Tried celebrex without much help. Left hip also bothering for 3 1/2 months. No injury or trauma. Pain level 0/10 currently. Worse lying on her side. No skin changes, fever, other complaints.  3/28: Patient reports her hip is completely better following injection. Left shoulder still bothersome, difficulty washing hair and sleeping. Wakes up at night. Pain up to 7/10 and sharp deep shoulder. No skin changes, fever, other complaints.  Past Medical History  Diagnosis Date  . PCOS (polycystic ovarian syndrome)   . Diabetes mellitus without complication (HCC)   . Chicken pox     Current Outpatient Prescriptions on File Prior to Visit  Medication Sig Dispense Refill  . canagliflozin (INVOKANA) 100 MG TABS tablet Take 1 tablet by mouth daily.    . diphenhydrAMINE (SOMINEX) 25 MG tablet Take 25 mg by mouth as needed.    . Dulaglutide (TRULICITY) 1.5 MG/0.5ML SOPN Inject 1.5 mg into the skin once a week.    Marland Kitchen glucose blood (FREESTYLE INSULINX TEST) test strip Use as directed    . HYDROcodone-acetaminophen (NORCO) 10-325 MG tablet Take 1 tablet by mouth every 8 (eight) hours as needed. 90 tablet 0  . levothyroxine (SYNTHROID, LEVOTHROID) 25 MCG tablet Take 25 mcg by mouth daily before breakfast.    . metFORMIN (GLUCOPHAGE) 1000 MG tablet Take 1 tablet by mouth 2 (two) times daily.    . Multiple Vitamins-Minerals (THERA-M) TABS Take 1 tablet by mouth daily.    . norethindrone (MICRONOR,CAMILA,ERRIN) 0.35 MG tablet Take 1 tablet by mouth daily.    . sertraline (ZOLOFT) 50 MG tablet Take 50 mg by mouth daily.    . Topiramate ER (TROKENDI XR) 100 MG  CP24 Take 100 mg by mouth daily.     No current facility-administered medications on file prior to visit.    Past Surgical History  Procedure Laterality Date  . Breast reduction surgery    . Wisdom tooth extraction      Allergies  Allergen Reactions  . Amoxicillin Rash    Social History   Social History  . Marital Status: Married    Spouse Name: N/A  . Number of Children: N/A  . Years of Education: N/A   Occupational History  . Theatre manager    Social History Main Topics  . Smoking status: Never Smoker   . Smokeless tobacco: Never Used  . Alcohol Use: 0.0 oz/week    0 Standard drinks or equivalent per week     Comment: socially  . Drug Use: No  . Sexual Activity:    Partners: Male   Other Topics Concern  . Not on file   Social History Narrative    Family History  Problem Relation Age of Onset  . Thyroid disease Mother   . Diabetes Father   . Hypertension Father   . Hyperlipidemia Father     BP 117/84 mmHg  Pulse 96  Ht 5\' 3"  (1.6 m)  Wt 184 lb (83.462 kg)  BMI 32.60 kg/m2  Review of Systems: See HPI above.    Objective:  Physical Exam:  Gen: NAD  Left shoulder: No swelling, ecchymoses.  No gross deformity. No TTP.  ER 45 degrees.  Abduction and flexion to 90 degrees. Positive Hawkins, Neers. Negative Yergasons. Strength 5/5 with empty can and resisted internal/external rotation.  No pain on resisted motions. NV intact distally.  Right shoulder: FROM without pain.    Assessment & Plan:  1. 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  Discussed options - went ahead with repeat intraarticular injection today.  Heat, tylenol/nsaids.  Continue with home exercises.  Consider physical therapy, third intraarticular injection if not improving.  F/u in 4-6 weeks.  After informed written consent, patient was seated on exam table. Left shoulder was prepped with alcohol swab and utilizing posterior approach, patient's left glenohumeral space was  injected with 3:1 marcaine: depomedrol. Patient tolerated the procedure well without immediate complications.

## 2016-02-26 ENCOUNTER — Encounter: Payer: Self-pay | Admitting: Physician Assistant

## 2016-02-26 ENCOUNTER — Other Ambulatory Visit: Payer: Self-pay | Admitting: Physician Assistant

## 2016-02-26 NOTE — Telephone Encounter (Signed)
Requesting: Hydrocodone Contract  None UDS  None Last OV  06/19/2015 Last Refill  #90 with 0 refills on 06/19/2015  Please Advise

## 2016-02-27 NOTE — Telephone Encounter (Signed)
Will refer to PCP. Thanks.

## 2016-03-04 ENCOUNTER — Encounter: Payer: Self-pay | Admitting: Physician Assistant

## 2016-03-04 ENCOUNTER — Ambulatory Visit (INDEPENDENT_AMBULATORY_CARE_PROVIDER_SITE_OTHER): Payer: Commercial Managed Care - PPO | Admitting: Physician Assistant

## 2016-03-04 VITALS — BP 124/87 | HR 103 | Temp 97.9°F | Resp 16 | Ht 64.0 in | Wt 188.1 lb

## 2016-03-04 DIAGNOSIS — M791 Myalgia, unspecified site: Secondary | ICD-10-CM

## 2016-03-04 DIAGNOSIS — M2550 Pain in unspecified joint: Secondary | ICD-10-CM | POA: Diagnosis not present

## 2016-03-04 MED ORDER — HYDROCODONE-ACETAMINOPHEN 10-325 MG PO TABS: 1.0000 | ORAL_TABLET | ORAL | 0 refills | Status: DC | PRN

## 2016-03-04 NOTE — Progress Notes (Signed)
Patient presents to clinic today to discuss referral to Rheumatology. Patient with chronic bilateral shoulder and hip pain, followed by Sports Medicine. Has had imaging revealing arthritic changes. Multiple injections recently with only slight improvement of symptoms. Is wanting Rheumatology assessment for polymyalgia rheumatica or other autoimmune cause of symptoms.  Is taking Cymbalta 20 mg as directed with improvement in mood and pain.    Past Medical History:  Diagnosis Date  . Chicken pox   . Diabetes mellitus without complication (HCC)   . PCOS (polycystic ovarian syndrome)     Current Outpatient Prescriptions on File Prior to Visit  Medication Sig Dispense Refill  . diphenhydrAMINE (SOMINEX) 25 MG tablet Take 25 mg by mouth as needed.    . Dulaglutide (TRULICITY) 1.5 MG/0.5ML SOPN Inject 1.5 mg into the skin once a week.    Marland Kitchen glucose blood (FREESTYLE INSULINX TEST) test strip Use as directed    . HYDROcodone-acetaminophen (NORCO) 10-325 MG tablet Take 1 tablet by mouth every 8 (eight) hours as needed. 90 tablet 0  . levothyroxine (SYNTHROID, LEVOTHROID) 25 MCG tablet Take 25 mcg by mouth daily before breakfast.    . metFORMIN (GLUCOPHAGE) 1000 MG tablet Take 1 tablet by mouth 2 (two) times daily.    . Multiple Vitamins-Minerals (THERA-M) TABS Take 1 tablet by mouth daily.    . norethindrone (MICRONOR,CAMILA,ERRIN) 0.35 MG tablet Take 1 tablet by mouth daily.    . sertraline (ZOLOFT) 50 MG tablet Take 50 mg by mouth daily.    . Topiramate ER (TROKENDI XR) 100 MG CP24 Take 100 mg by mouth daily.     No current facility-administered medications on file prior to visit.     Allergies  Allergen Reactions  . Amoxicillin Rash    Family History  Problem Relation Age of Onset  . Thyroid disease Mother   . Diabetes Father   . Hypertension Father   . Hyperlipidemia Father     Social History   Social History  . Marital status: Married    Spouse name: N/A  . Number of children:  N/A  . Years of education: N/A   Occupational History  . Theatre manager    Social History Main Topics  . Smoking status: Never Smoker  . Smokeless tobacco: Never Used  . Alcohol use 0.0 oz/week     Comment: socially  . Drug use: No  . Sexual activity: Yes    Partners: Male   Other Topics Concern  . None   Social History Narrative  . None   Review of Systems - See HPI.  All other ROS are negative.  Ht 5\' 4"  (1.626 m)   Wt 188 lb 2 oz (85.3 kg)   LMP 02/12/2016   BMI 32.29 kg/m   Physical Exam  Constitutional: She is oriented to person, place, and time and well-developed, well-nourished, and in no distress.  HENT:  Head: Normocephalic and atraumatic.  Eyes: Conjunctivae are normal.  Cardiovascular: Normal rate, regular rhythm, normal heart sounds and intact distal pulses.   Pulmonary/Chest: Effort normal and breath sounds normal. No respiratory distress. She has no wheezes. She has no rales. She exhibits no tenderness.  Neurological: She is alert and oriented to person, place, and time.  Skin: Skin is warm and dry. No rash noted.  Vitals reviewed.   No results found for this or any previous visit (from the past 2160 hour(s)).  Assessment/Plan: 1. Myalgia Pain medication refilled. Referral placed to Rheumatology. Patient is to verify dosage of Cymbalta  so we can increase. FU discussed. HYDROcodone-acetaminophen (NORCO) 10-325 MG tablet; Take 1 tablet by mouth every 4 (four) hours as needed.  Dispense: 30 tablet; Refill: 0 - Ambulatory referral to Rheumatology    Piedad ClimesMartin, Rivan Siordia Cody, PA-C

## 2016-03-04 NOTE — Patient Instructions (Signed)
Please keep your phone on. You will be contacted for assessment by rheumatology.  Please go home and verify dose of Cymbalta so I can increase further for you. This will help with both mood and pain. Continue staying active. Make sure to use hydrocodone sparingly. Tylenol for mild pain.

## 2016-03-05 ENCOUNTER — Encounter: Payer: Self-pay | Admitting: Physician Assistant

## 2016-03-05 MED ORDER — DULOXETINE HCL 60 MG PO CPEP: 60.0000 mg | ORAL_CAPSULE | Freq: Every day | ORAL | 3 refills | Status: DC

## 2016-03-22 ENCOUNTER — Encounter: Payer: Self-pay | Admitting: Physician Assistant

## 2016-03-22 DIAGNOSIS — R0681 Apnea, not elsewhere classified: Secondary | ICD-10-CM

## 2016-05-21 DIAGNOSIS — M797 Fibromyalgia: Secondary | ICD-10-CM | POA: Insufficient documentation

## 2016-05-27 ENCOUNTER — Encounter: Payer: Self-pay | Admitting: Endocrinology

## 2016-05-27 ENCOUNTER — Ambulatory Visit (INDEPENDENT_AMBULATORY_CARE_PROVIDER_SITE_OTHER): Payer: Commercial Managed Care - PPO | Admitting: Endocrinology

## 2016-05-27 VITALS — BP 128/94 | HR 106 | Ht 64.0 in | Wt 187.0 lb

## 2016-05-27 DIAGNOSIS — E782 Mixed hyperlipidemia: Secondary | ICD-10-CM

## 2016-05-27 DIAGNOSIS — E038 Other specified hypothyroidism: Secondary | ICD-10-CM

## 2016-05-27 DIAGNOSIS — E1165 Type 2 diabetes mellitus with hyperglycemia: Secondary | ICD-10-CM

## 2016-05-27 DIAGNOSIS — E063 Autoimmune thyroiditis: Secondary | ICD-10-CM

## 2016-05-27 DIAGNOSIS — E282 Polycystic ovarian syndrome: Secondary | ICD-10-CM

## 2016-05-27 LAB — POCT GLYCOSYLATED HEMOGLOBIN (HGB A1C): Hemoglobin A1C: 8

## 2016-05-27 MED ORDER — METFORMIN HCL ER 500 MG PO TB24: 2000.0000 mg | ORAL_TABLET | Freq: Every day | ORAL | 0 refills | Status: DC

## 2016-05-27 MED ORDER — GLIMEPIRIDE 1 MG PO TABS: 1.0000 mg | ORAL_TABLET | Freq: Every day | ORAL | 0 refills | Status: DC

## 2016-05-27 NOTE — Addendum Note (Signed)
Addended by: Bobbye Riggs on: 05/27/2016 02:37 PM   Modules accepted: Orders

## 2016-05-27 NOTE — Progress Notes (Signed)
Patient ID: Lydia Taylor, female   DOB: Jul 21, 1975, 41 y.o.   MRN: [id number]           Reason for Appointment: Consultation for Type 2 Diabetes  Referring physician: None   History of Present Illness:          Date of diagnosis of type 2 diabetes mellitus: ?  2012        Background history:    She had been diagnosed to have PCOS at age 86 and was on low-dose metformin since then. She was diagnosed to have diabetes when her gynecologist found her to have glycosuria Initially treated with higher doses of metformin but not clear what other medications she had taken Also about 4 years ago she started exercising vigorously and was able to lose 40 pounds and control her blood sugars well  About 3 years ago because of difficulty with exercising her blood sugars were higher and she was started on Invokana 100 mg Subsequently Trulicity was added  She thinks that her A1c has been usually over 7%, no detailed records are available  Recent history:       Non-insulin hypoglycemic drugs the patient is taking are: Metformin 2 g at dinner, Invokana 300 mg daily, Trulicity 1.5 mg weekly  Current management, blood sugar patterns and problems identified:  She checks her blood sugars irregularly and mostly in the mornings as outlined below.  She is accurate some difficulty with exercising because of fibromyalgia but is trying to do more walking with shorter duration but more frequency.  She has not been able to follow her diet well partly because of eating out frequently and not cooking at home.  She does not usually make low-fat choices  Also tends to have more sweets.  Recently also started having snacks with nuts  Her weight has been about the same in the last few months           Side effects from medications have been: Nausea from metformin in the morning  Compliance with the medical regimen: Inconsistent Hypoglycemia:   never  Glucose monitoring:  done 0-1 times a day          Glucometer:  FreeStyle .      Blood Glucose readings   Range by recall: Am sugars: 103-170   Self-care: The diet that the patient has been following is: None .     Meal times are:  Breakfast is at 8 AM Lunch: 2 PM Typical meal intake: Breakfast is Biscuits; eating out regularly, snacks as above               Dietician visit, most recent: 2008               Exercise:  walking up to 30 minutes a day  Weight history:   Wt Readings from Last 3 Encounters:  05/27/16 187 lb (84.8 kg)  03/04/16 188 lb 2 oz (85.3 kg)  08/08/15 184 lb (83.5 kg)    Glycemic control:  7.5   Lab Results  Component Value Date   HGBA1C 10.0 (H) 12/23/2011   Lab Results  Component Value Date   CREATININE 0.4 12/23/2011   No results found for: MICRALBCREAT     Allergies as of 05/27/2016      Reactions   Amoxicillin Rash      Medication List       Accurate as of 05/27/16 12:50 PM. Always use your most recent med list.  diphenhydrAMINE 25 MG tablet Commonly known as:  SOMINEX Take 25 mg by mouth as needed.   DULoxetine 60 MG capsule Commonly known as:  CYMBALTA Take 1 capsule (60 mg total) by mouth daily.   FREESTYLE INSULINX TEST test strip Generic drug:  glucose blood Use as directed   gabapentin 100 MG capsule Commonly known as:  NEURONTIN Take 100 mg by mouth 3 (three) times daily. Take 1 tablet 3 times daily   HYDROcodone-acetaminophen 10-325 MG tablet Commonly known as:  NORCO Take 1 tablet by mouth every 4 (four) hours as needed.   INVOKANA 300 MG Tabs tablet Generic drug:  canagliflozin Take 300 mg by mouth daily before breakfast.   levothyroxine 25 MCG tablet Commonly known as:  SYNTHROID, LEVOTHROID Take 25 mcg by mouth daily before breakfast.   metFORMIN 1000 MG tablet Commonly known as:  GLUCOPHAGE Take 1 tablet by mouth 2 (two) times daily.   norethindrone 0.35 MG tablet Commonly known as:  MICRONOR,CAMILA,ERRIN Take 1 tablet by mouth daily.     THERA-M Tabs Take 1 tablet by mouth daily.   TRULICITY 1.5 MG/0.5ML Sopn Generic drug:  Dulaglutide Inject 1.5 mg into the skin once a week.       Allergies:  Allergies  Allergen Reactions  . Amoxicillin Rash    Past Medical History:  Diagnosis Date  . Chicken pox   . Diabetes mellitus without complication (HCC)   . PCOS (polycystic ovarian syndrome)     Past Surgical History:  Procedure Laterality Date  . BREAST REDUCTION SURGERY    . WISDOM TOOTH EXTRACTION      Family History  Problem Relation Age of Onset  . Thyroid disease Mother   . Diabetes Father   . Hypertension Father   . Hyperlipidemia Father   . Stroke Maternal Grandmother     Social History:  reports that she has never smoked. She has never used smokeless tobacco. She reports that she drinks alcohol. She reports that she does not use drugs.   ogm ht Review of Systems  Constitutional: Negative for weight loss and reduced appetite.  HENT: Negative for headaches.   Respiratory: Positive for daytime sleepiness. Negative for shortness of breath.   Cardiovascular: Negative for leg swelling.  Endocrine: Negative for fatigue and polydipsia.       Menstrual cycles are regular with using birth control pill.  Musculoskeletal: Positive for joint pain and muscle aches.  Skin: Negative for rash.       Long-standing facial hair, using electrolysis for treatment  Neurological: Negative for numbness and tingling.  Psychiatric/Behavioral: Positive for depressed mood.     Lipid history: Last LDL 130   No results found for: CHOL, HDL, LDLCALC, LDLDIRECT, TRIG, CHOLHDL         Hypertension: Blood pressure varies, never on treatment  Most recent eye exam was in 2017, not clear if she had retinopathy  Most recent foot exam: 1/18  ?  Hypothyroidism: She does not think she felt better with starting levothyroxine supplementation, probably for asymptomatic high TSH, records are not available Last TSH  2.6  LABS:  No visits with results within 1 Week(s) from this visit.  Latest known visit with results is:  Office Visit on 06/19/2015  Component Date Value Ref Range Status  . TSH 06/19/2015 2.39  0.35 - 4.50 uIU/mL Final    Physical Examination:  BP (!) 128/94   Pulse (!) 106   Ht 5\' 4"  (1.626 m)   Wt 187 lb (  84.8 kg)   SpO2 98%   BMI 32.10 kg/m   GENERAL:         Patient has generalized obesity.   HEENT:         Eye exam shows normal external appearance.  Fundus exam shows no retinopathy.   NECK:   There is no lymphadenopathy Thyroid is not enlarged and no nodules felt.  Carotids are normal to palpation and no bruit heard LUNGS:         Chest is symmetrical. Lungs are clear to auscultation.Marland Kitchen   HEART:         Heart sounds:  S1 and S2 are normal. No murmur or click heard., no S3 or S4.   ABDOMEN:   There is no distention present. Liver and spleen are not palpable. No other mass or tenderness present.   NEUROLOGICAL:   biceps reflexes appear normal  Diabetic Foot Exam - Simple   Simple Foot Form Diabetic Foot exam was performed with the following findings:  Yes 05/27/2016 11:50 AM  Visual Inspection No deformities, no ulcerations, no other skin breakdown bilaterally:  Yes Sensation Testing Intact to touch and monofilament testing bilaterally:  Yes Pulse Check Posterior Tibialis and Dorsalis pulse intact bilaterally:  Yes Comments            Vibration sense is Mildly reduced in distal first toes. MUSCULOSKELETAL:  There is no swelling or deformity of the peripheral joints.   EXTREMITIES:     There is no edema. No skin lesions present.Marland Kitchen SKIN:       No rash or lesions of concern. has minimal truncal hair         ASSESSMENT:  Diabetes type 2, uncontrolled    She appears to have had persistently high readings of A1c over 7% for some time Has been on triple therapy with Trulicity, metformin and Invokana for at least 2 years She takes 2000 mg of metformin in the evening,  this is the immediate release  Continues to have difficulty losing weight currently BMI 32 Although she is trying to exercise she has some limitations because of her fibromyalgia Her diet is relatively high fat and high calorie, does not appear to have much motivation to change at this time on her own  Complications of diabetes: None evident, not clear if she has been told to have background retinopathy  PCOS: She continues to have some hirsutism, menstrual cycle regulated by but control pills   HYPERLIPIDEMIA: Her last LDL was 130 and with her having diabetes and family history of cardiovascular disease would recommend a statin drug but patient is reluctant to start now  Probable hypertension: She does not think her blood pressure is consistently high  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  If she has consistently high readings after meals may consider adding Prandin or Precose  Discussed data on safety of Invokana and recommended she continue this drug for short and long-term benefits  Follow-up in  4 weeks with labs including lipids, fructosamine, TSH, free testosterone Consider ACE inhibitor for blood pressure regulation, again patient is reluctant to start today  Patient Instructions  Check blood sugars on waking up 2-3x per week   Also check blood sugars about 2 hours after a meal and do this after different meals by rotation  Recommended blood  sugar levels on waking up is 90-130 and about 2 hours after meal is 130-160  Please bring your blood sugar monitor to each visit, thank you  New Rx: Metformin and Glimeperide  Low fat meals/snacks all the time    Counseling time on subjects discussed above is over 50% of today's 60 minute visit   Lydia Taylor 05/27/2016, 12:50 PM   Note: This office note was prepared with Insurance underwriter. Any transcriptional errors that result from this process are unintentional.

## 2016-05-27 NOTE — Patient Instructions (Signed)
Check blood sugars on waking up 2-3x per week   Also check blood sugars about 2 hours after a meal and do this after different meals by rotation  Recommended blood sugar levels on waking up is 90-130 and about 2 hours after meal is 130-160  Please bring your blood sugar monitor to each visit, thank you  New Rx: Metformin and Glimeperide  Low fat meals/snacks all the time

## 2016-05-28 ENCOUNTER — Institutional Professional Consult (permissible substitution): Payer: Commercial Managed Care - PPO | Admitting: Pulmonary Disease

## 2016-06-06 ENCOUNTER — Institutional Professional Consult (permissible substitution): Payer: Commercial Managed Care - PPO | Admitting: Pulmonary Disease

## 2016-06-13 ENCOUNTER — Encounter: Payer: Commercial Managed Care - PPO | Attending: Endocrinology | Admitting: Dietician

## 2016-06-13 DIAGNOSIS — Z713 Dietary counseling and surveillance: Secondary | ICD-10-CM | POA: Diagnosis present

## 2016-06-13 DIAGNOSIS — E1169 Type 2 diabetes mellitus with other specified complication: Secondary | ICD-10-CM

## 2016-06-13 DIAGNOSIS — E669 Obesity, unspecified: Secondary | ICD-10-CM

## 2016-06-13 DIAGNOSIS — E1165 Type 2 diabetes mellitus with hyperglycemia: Secondary | ICD-10-CM | POA: Insufficient documentation

## 2016-06-13 NOTE — Progress Notes (Signed)
Diabetes Self-Management Education  Visit Type: First/Initial  Appt. Start Time: 1615 Appt. End Time: 1730  06/13/2016  Ms. Lydia Taylor, identified by name and date of birth, is a 41 y.o. female with a diagnosis of Diabetes: Type 2. Other hx includes PCOS and fibromyalgia.  Patient reports a weight loss of 40 lbs 4 years ago by increasing exercise.  This weight is stable now.  She can no longer exercise the same amount due to pain secondary to fibromyalgia but does continue to walk.  She also reports a hx of vitamin D deficiency.  Medications include Metformin XR, glimepiride, Trulicity, and Invokanana.  Patient lives with her husband and 61 yo son.  They eat out most often due to the fibromyalgia fatigue and pain.  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  She states that she has a lot of problems eating when she is bored and has a "major sweet tooth".   ASSESSMENT  Height 5\' 4"  (1.626 m), weight 187 lb (84.8 kg). Body mass index is 32.1 kg/m.      Diabetes Self-Management Education - 06/13/16 1626      Visit Information   Visit Type First/Initial     Initial Visit   Diabetes Type Type 2   Are you currently following a meal plan? No   Are you taking your medications as prescribed? Yes   Date Diagnosed 2012     Health Coping   How would you rate your overall health? Fair     Psychosocial Assessment   Patient Belief/Attitude about Diabetes Defeat/Burnout  but burned out   Self-care barriers None   Self-management support Doctor's office;Family   Other persons present Patient   Patient Concerns Nutrition/Meal planning;Glycemic Control;Weight Control   Special Needs None   Preferred Learning Style No preference indicated   Learning Readiness Ready   How often do you need to have someone help you when you read instructions, pamphlets, or other written materials from your doctor or pharmacy? 1 - Never   What is the last grade  level you completed in school? 12th grade     Pre-Education Assessment   Patient understands the diabetes disease and treatment process. Needs Instruction   Patient understands incorporating nutritional management into lifestyle. Needs Instruction   Patient undertands incorporating physical activity into lifestyle. Needs Review   Patient understands using medications safely. Demonstrates understanding / competency   Patient understands monitoring blood glucose, interpreting and using results Needs Review   Patient understands prevention, detection, and treatment of acute complications. Needs Review   Patient understands prevention, detection, and treatment of chronic complications. Needs Review   Patient understands how to develop strategies to address psychosocial issues. Needs Review   Patient understands how to develop strategies to promote health/change behavior. Needs Review     Complications   Last HgB A1C per patient/outside source 8 %  05/27/16   How often do you check your blood sugar? 3-4 times / week   Fasting Blood glucose range (mg/dL) 16-109   Number of hypoglycemic episodes per month 0   Number of hyperglycemic episodes per week 0   Have you had a dilated eye exam in the past 12 months? Yes   Have you had a dental exam in the past 12 months? Yes   Are you checking your feet? No     Dietary Intake   Breakfast Mrs[employer] Winner[employer] chicken biscuit combo but rare but usually lite greek yogurt  or bojangles 1-2 times per week   Snack (morning) grapes   Lunch half sweet tea, cinnamon bun and 2 cookies OR fast food sandwich OR baked potato leftover from [employer] OR sandwich from home   Snack (afternoon) occasional cookie or rare chips when she has not had lunch OR nuts   Dinner cereal OR [employer] or [place]  7-8   Snack (evening) none   Beverage(s) water, half sweet tea, crystal lite, occasional regular pepsi     Exercise   Exercise Type Light (walking / raking leaves)   How many  days per week to you exercise? 5   How many minutes per day do you exercise? 20   Total minutes per week of exercise 100     Patient Education   Previous Diabetes Education Yes (please comment)  2007 but can't remember why   Disease state  Definition of diabetes, type 1 and 2, and the diagnosis of diabetes   Nutrition management  Role of diet in the treatment of diabetes and the relationship between the three main macronutrients and blood glucose level;Food label reading, portion sizes and measuring food.;Meal options for control of blood glucose level and chronic complications.;Meal timing in regards to the patients' current diabetes medication.;Information on hints to eating out and maintain blood glucose control.   Physical activity and exercise  Role of exercise on diabetes management, blood pressure control and cardiac health.   Monitoring Identified appropriate SMBG and/or A1C goals.;Daily foot exams;Yearly dilated eye exam   Acute complications Taught treatment of hypoglycemia - the 15 rule.   Chronic complications Relationship between chronic complications and blood glucose control;Assessed and discussed foot care and prevention of foot problems   Psychosocial adjustment Worked with patient to identify barriers to care and solutions;Role of stress on diabetes;Identified and addressed patients feelings and concerns about diabetes   Personal strategies to promote health Lifestyle issues that need to be addressed for better diabetes care     Individualized Goals (developed by patient)   Nutrition General guidelines for healthy choices and portions discussed   Physical Activity Exercise 5-7 days per week;30 minutes per day   Medications take my medication as prescribed   Monitoring  test my blood glucose as discussed   Reducing Risk examine blood glucose patterns;do foot checks daily   Health Coping discuss diabetes with (comment)  MD?RD     Post-Education Assessment   Patient  understands the diabetes disease and treatment process. Demonstrates understanding / competency   Patient understands incorporating nutritional management into lifestyle. Needs Review   Patient undertands incorporating physical activity into lifestyle. Demonstrates understanding / competency   Patient understands using medications safely. Demonstrates understanding / competency   Patient understands monitoring blood glucose, interpreting and using results Demonstrates understanding / competency   Patient understands prevention, detection, and treatment of acute complications. Demonstrates understanding / competency   Patient understands prevention, detection, and treatment of chronic complications. Demonstrates understanding / competency   Patient understands how to develop strategies to address psychosocial issues. Needs Review   Patient understands how to develop strategies to promote health/change behavior. Needs Review     Outcomes   Expected Outcomes Demonstrated interest in learning. Expect positive outcomes   Future DMSE 4-6 wks   Program Status Completed      Individualized Plan for Diabetes Self-Management Training:   Learning Objective:  Patient will have a greater understanding of diabetes self-management. Patient education plan is to attend individual and/or group sessions per assessed needs and  concerns.   Plan:   Patient Instructions  Choose mindfully, eat slowly, stop when you are satisfied. Keep an active lifestyle. Does your vitamin contain 1000 units vitamin D? What should you do when you are bored rather than eat? Move OR talk with someone, avoid food at your desk.  Aim for 2-3 Carb Choices per meal (30-45 grams) +/- 1 either way  Aim for 0-1 Carbs per snack if hungry  Include protein in moderation with your meals and snacks Consider reading food labels for Total Carbohydrate and Fat Grams of foods Consider  increasing your activity level by walking for 30 minutes  daily as tolerated Consider checking BG at alternate times per day as directed by MD  Consider taking medication as directed by MD     Expected Outcomes:  Demonstrated interest in learning. Expect positive outcomes  Education material provided: Living Well with Diabetes, Food label handouts, A1C conversion sheet, Meal plan card, My Plate, Snack sheet and Support group flyer, breakfast ideas, Hypoglycemia sheet, Dining out with diabetes and fast Healthy fast food choices.    If problems or questions, patient to contact team via:  Phone and Email  Future DSME appointment: 4-6 wks

## 2016-06-13 NOTE — Patient Instructions (Signed)
Choose mindfully, eat slowly, stop when you are satisfied. Keep an active lifestyle. Does your vitamin contain 1000 units vitamin D? What should you do when you are bored rather than eat? Move OR talk with someone, avoid food at your desk.  Aim for 2-3 Carb Choices per meal (30-45 grams) +/- 1 either way  Aim for 0-1 Carbs per snack if hungry  Include protein in moderation with your meals and snacks Consider reading food labels for Total Carbohydrate and Fat Grams of foods Consider  increasing your activity level by walking for 30 minutes daily as tolerated Consider checking BG at alternate times per day as directed by MD  Consider taking medication as directed by MD

## 2016-06-21 ENCOUNTER — Other Ambulatory Visit (INDEPENDENT_AMBULATORY_CARE_PROVIDER_SITE_OTHER): Payer: Commercial Managed Care - PPO

## 2016-06-21 DIAGNOSIS — E1165 Type 2 diabetes mellitus with hyperglycemia: Secondary | ICD-10-CM | POA: Diagnosis not present

## 2016-06-21 DIAGNOSIS — E063 Autoimmune thyroiditis: Secondary | ICD-10-CM

## 2016-06-21 DIAGNOSIS — E038 Other specified hypothyroidism: Secondary | ICD-10-CM | POA: Diagnosis not present

## 2016-06-21 DIAGNOSIS — E282 Polycystic ovarian syndrome: Secondary | ICD-10-CM

## 2016-06-21 LAB — LIPID PANEL
CHOLESTEROL: 202 mg/dL — AB (ref 0–200)
HDL: 46.5 mg/dL (ref >39.00)
LDL CALC: 132 mg/dL — AB (ref 0–99)
NONHDL: 155.66
Total CHOL/HDL Ratio: 4
Triglycerides: 119 mg/dL (ref 0.0–149.0)
VLDL: 23.8 mg/dL (ref 0.0–40.0)

## 2016-06-21 LAB — BASIC METABOLIC PANEL
BUN: 15 mg/dL (ref 6–23)
CHLORIDE: 101 meq/L (ref 96–112)
CO2: 27 mEq/L (ref 19–32)
Calcium: 10 mg/dL (ref 8.4–10.5)
Creatinine, Ser: 0.55 mg/dL (ref 0.40–1.20)
GFR: 157.25 mL/min (ref >60.00)
GLUCOSE: 150 mg/dL — AB (ref 70–99)
POTASSIUM: 4.1 meq/L (ref 3.5–5.1)
SODIUM: 135 meq/L (ref 135–145)

## 2016-06-21 LAB — TSH: TSH: 1.46 u[IU]/mL (ref 0.35–4.50)

## 2016-06-23 LAB — TESTOSTERONE, FREE, TOTAL, SHBG
Sex Hormone Binding: 19.6 nmol/L — ABNORMAL LOW (ref 24.6–122.0)
TESTOSTERONE: 5 ng/dL — AB (ref 8–48)
Testosterone, Free: 1.2 pg/mL (ref 0.0–4.2)

## 2016-06-23 LAB — FRUCTOSAMINE: Fructosamine: 309 umol/L — ABNORMAL HIGH (ref 0–285)

## 2016-06-24 ENCOUNTER — Ambulatory Visit: Payer: Commercial Managed Care - PPO | Admitting: Endocrinology

## 2016-07-18 ENCOUNTER — Encounter: Payer: Self-pay | Admitting: Endocrinology

## 2016-07-18 ENCOUNTER — Ambulatory Visit (INDEPENDENT_AMBULATORY_CARE_PROVIDER_SITE_OTHER): Payer: Commercial Managed Care - PPO | Admitting: Endocrinology

## 2016-07-18 VITALS — BP 114/84 | HR 113 | Ht 64.0 in | Wt 187.0 lb

## 2016-07-18 DIAGNOSIS — E669 Obesity, unspecified: Secondary | ICD-10-CM

## 2016-07-18 DIAGNOSIS — L680 Hirsutism: Secondary | ICD-10-CM

## 2016-07-18 DIAGNOSIS — E7800 Pure hypercholesterolemia, unspecified: Secondary | ICD-10-CM

## 2016-07-18 DIAGNOSIS — E1165 Type 2 diabetes mellitus with hyperglycemia: Secondary | ICD-10-CM

## 2016-07-18 DIAGNOSIS — E039 Hypothyroidism, unspecified: Secondary | ICD-10-CM

## 2016-07-18 DIAGNOSIS — E038 Other specified hypothyroidism: Secondary | ICD-10-CM

## 2016-07-18 MED ORDER — FREESTYLE LIBRE SENSOR SYSTEM MISC: 1.0000 | 3 refills | Status: DC

## 2016-07-18 MED ORDER — PRAVASTATIN SODIUM 40 MG PO TABS: 40.0000 mg | ORAL_TABLET | Freq: Every day | ORAL | 3 refills | Status: DC

## 2016-07-18 MED ORDER — FREESTYLE LIBRE READER DEVI: 1.0000 | 0 refills | Status: DC

## 2016-07-18 NOTE — Progress Notes (Signed)
Patient ID: Lydia Taylor, female   DOB: Oct 22, 1975, 41 y.o.   MRN: [id number]           Reason for Appointment: Follow-up for Type 2 Diabetes and other problems    History of Present Illness:          Date of diagnosis of type 2 diabetes mellitus: ?  2012        Background history:    She had been diagnosed to have PCOS at age 34 and was on low-dose metformin since then. She was diagnosed to have diabetes when her gynecologist found her to have glycosuria Initially treated with higher doses of metformin but not clear what other medications she had taken Also about 4 years ago she started exercising vigorously and was able to lose 40 pounds and control her blood sugars well  About 3 years ago because of difficulty with exercising her blood sugars were higher and she was started on Invokana 100 mg Subsequently Trulicity was added  She thinks that her A1c has been usually over 7%, no detailed records are available  Recent history:       Non-insulin hypoglycemic drugs the patient is taking are: Metformin 2 g at dinner, Invokana 300 mg daily, Trulicity 1.5 mg weekly, Amaryl 1 mg daily  Her last A1c was 8% Fructosamine last month was mildly increased at 309  Current management, blood sugar patterns and problems identified:  She was started on Amaryl in addition to her 3 drug regimen on her initial consultation about 2 months ago but she is now coming back for follow-up  She has done her blood sugar readings only twice in the last month and does not like to check them much  Lab glucose was 150 fasting and her home fasting glucose readings were 102 and 201 last month  She has seen the dietitian last month and is starting to make some changes but has not lost any weight  She is usually trying to exercise with walking at least 30 minutes at a time  With using metformin ER she is able to tolerate this without any nausea now       Side effects from medications have been: Nausea  from metformin in the morning  Compliance with the medical regimen: Fair Hypoglycemia:   never  Glucose monitoring:  done 0-1 times a day         Glucometer:  FreeStyle .      Blood Glucose readings as above   Self-care: The diet that the patient has been following is: None .     Meal times are:  Breakfast is at 8 AM Lunch: 2 PM Typical meal intake: Breakfast is Biscuits; eating out regularly, snacks as above               Dietician visit, most recent: 2/18               Exercise:  walking up to 30-40 minutes a day  Weight history:   Wt Readings from Last 3 Encounters:  07/18/16 187 lb (84.8 kg)  06/13/16 187 lb (84.8 kg)  05/27/16 187 lb (84.8 kg)    Glycemic control:    Lab Results  Component Value Date   HGBA1C 8.0 05/27/2016   HGBA1C 10.0 (H) 12/23/2011   Lab Results  Component Value Date   LDLCALC 132 (H) 06/21/2016   CREATININE 0.55 06/21/2016   No results found for: MICRALBCREAT  No visits with results within 1 Week(s) from  this visit.  Latest known visit with results is:  Lab on 06/21/2016  Component Date Value Ref Range Status  . Sodium 06/21/2016 135  135 - 145 mEq/L Final  . Potassium 06/21/2016 4.1  3.5 - 5.1 mEq/L Final  . Chloride 06/21/2016 101  96 - 112 mEq/L Final  . CO2 06/21/2016 27  19 - 32 mEq/L Final  . Glucose, Bld 06/21/2016 150* 70 - 99 mg/dL Final  . BUN 00/86/7619 15  6 - 23 mg/dL Final  . Creatinine, Ser 06/21/2016 0.55  0.40 - 1.20 mg/dL Final  . Calcium 50/93/2671 10.0  8.4 - 10.5 mg/dL Final  . GFR 24/58/0998 157.25  >60.00 mL/min Final  . Fructosamine 06/21/2016 309* 0 - 285 umol/L Final   Comment: Published reference interval for apparently healthy subjects between age 11 and 57 is 80 - 285 umol/L and in a poorly controlled diabetic population is 228 - 563 umol/L with a mean of 396 umol/L.   Marland Kitchen Cholesterol 06/21/2016 202* 0 - 200 mg/dL Final  . Triglycerides 06/21/2016 119.0  0.0 - 149.0 mg/dL Final  . HDL 33/82/5053 46.50   >39.00 mg/dL Final  . VLDL 97/67/3419 23.8  0.0 - 40.0 mg/dL Final  . LDL Cholesterol 06/21/2016 132* 0 - 99 mg/dL Final  . Total CHOL/HDL Ratio 06/21/2016 4   Final  . NonHDL 06/21/2016 155.66   Final  . TSH 06/21/2016 1.46  0.35 - 4.50 uIU/mL Final  . Testosterone 06/21/2016 5* 8 - 48 ng/dL Final  . Testosterone, Free 06/21/2016 1.2  0.0 - 4.2 pg/mL Final  . Sex Hormone Binding 06/21/2016 19.6* 24.6 - 122.0 nmol/L Final      Allergies as of 07/18/2016      Reactions   Amoxicillin Rash      Medication List       Accurate as of 07/18/16  8:49 PM. Always use your most recent med list.          diphenhydrAMINE 25 MG tablet Commonly known as:  SOMINEX Take 25 mg by mouth as needed.   DULoxetine 60 MG capsule Commonly known as:  CYMBALTA Take 1 capsule (60 mg total) by mouth daily.   FREESTYLE INSULINX TEST test strip Generic drug:  glucose blood Use as directed   FREESTYLE LIBRE READER Devi 1 Device by Does not apply route as directed.   FREESTYLE LIBRE SENSOR SYSTEM Misc 1 strip by Does not apply route once a week. Apply to upper arm and change sensor every 10 days   gabapentin 100 MG capsule Commonly known as:  NEURONTIN Take 100 mg by mouth 3 (three) times daily. Take 1 tablet 3 times daily   glimepiride 1 MG tablet Commonly known as:  AMARYL Take 1 tablet (1 mg total) by mouth daily with breakfast.   HYDROcodone-acetaminophen 10-325 MG tablet Commonly known as:  NORCO Take 1 tablet by mouth every 4 (four) hours as needed.   INVOKANA 300 MG Tabs tablet Generic drug:  canagliflozin Take 300 mg by mouth daily before breakfast.   levothyroxine 25 MCG tablet Commonly known as:  SYNTHROID, LEVOTHROID Take 25 mcg by mouth daily before breakfast.   metFORMIN 500 MG 24 hr tablet Commonly known as:  GLUCOPHAGE-XR Take 4 tablets (2,000 mg total) by mouth daily with supper.   norethindrone 0.35 MG tablet Commonly known as:  MICRONOR,CAMILA,ERRIN Take 1 tablet by  mouth daily.   pravastatin 40 MG tablet Commonly known as:  PRAVACHOL Take 1 tablet (40 mg total) by  mouth daily.   THERA-M Tabs Take 1 tablet by mouth daily.   TRULICITY 1.5 MG/0.5ML Sopn Generic drug:  Dulaglutide Inject 1.5 mg into the skin once a week.       Allergies:  Allergies  Allergen Reactions  . Amoxicillin Rash    Past Medical History:  Diagnosis Date  . Chicken pox   . Diabetes mellitus without complication (HCC)   . PCOS (polycystic ovarian syndrome)     Past Surgical History:  Procedure Laterality Date  . BREAST REDUCTION SURGERY    . WISDOM TOOTH EXTRACTION      Family History  Problem Relation Age of Onset  . Thyroid disease Mother   . Diabetes Father   . Hypertension Father   . Hyperlipidemia Father   . Stroke Maternal Grandmother     Social History:  reports that she has never smoked. She has never used smokeless tobacco. She reports that she drinks alcohol. She reports that she does not use drugs.   Review of Systems   History of hirsutism: Testosterone level is normal  Lipid history: Last LDL As follows, not on treatment as yet    Lab Results  Component Value Date   CHOL 202 (H) 06/21/2016   HDL 46.50 06/21/2016   LDLCALC 132 (H) 06/21/2016   TRIG 119.0 06/21/2016   CHOLHDL 4 06/21/2016           Hypertension: Blood pressure varies, never Has been on treatment  Most recent eye exam was in 2017, not clear if she had retinopathy  Most recent foot exam: 1/18  ?  Hypothyroidism: She does not think she felt better with starting levothyroxine supplementation, probably for asymptomatic high TSH done by PCP Last TSH 2.6  LABS:  No visits with results within 1 Week(s) from this visit.  Latest known visit with results is:  Lab on 06/21/2016  Component Date Value Ref Range Status  . Sodium 06/21/2016 135  135 - 145 mEq/L Final  . Potassium 06/21/2016 4.1  3.5 - 5.1 mEq/L Final  . Chloride 06/21/2016 101  96 - 112 mEq/L Final    . CO2 06/21/2016 27  19 - 32 mEq/L Final  . Glucose, Bld 06/21/2016 150* 70 - 99 mg/dL Final  . BUN 16/02/9603 15  6 - 23 mg/dL Final  . Creatinine, Ser 06/21/2016 0.55  0.40 - 1.20 mg/dL Final  . Calcium 54/01/8118 10.0  8.4 - 10.5 mg/dL Final  . GFR 14/78/2956 157.25  >60.00 mL/min Final  . Fructosamine 06/21/2016 309* 0 - 285 umol/L Final   Comment: Published reference interval for apparently healthy subjects between age 51 and 4 is 1 - 285 umol/L and in a poorly controlled diabetic population is 228 - 563 umol/L with a mean of 396 umol/L.   Marland Kitchen Cholesterol 06/21/2016 202* 0 - 200 mg/dL Final  . Triglycerides 06/21/2016 119.0  0.0 - 149.0 mg/dL Final  . HDL 21/30/8657 46.50  >39.00 mg/dL Final  . VLDL 84/69/6295 23.8  0.0 - 40.0 mg/dL Final  . LDL Cholesterol 06/21/2016 132* 0 - 99 mg/dL Final  . Total CHOL/HDL Ratio 06/21/2016 4   Final  . NonHDL 06/21/2016 155.66   Final  . TSH 06/21/2016 1.46  0.35 - 4.50 uIU/mL Final  . Testosterone 06/21/2016 5* 8 - 48 ng/dL Final  . Testosterone, Free 06/21/2016 1.2  0.0 - 4.2 pg/mL Final  . Sex Hormone Binding 06/21/2016 19.6* 24.6 - 122.0 nmol/L Final    Physical Examination:  BP 114/84  Pulse (!) 113   Ht 5\' 4"  (1.626 m)   Wt 187 lb (84.8 kg)   SpO2 98%   BMI 32.10 kg/m    ASSESSMENT:  Diabetes type 2, uncontrolled  See history of present illness for detailed discussion of current diabetes management, blood sugar patterns and problems identified  She was started on Amaryl in addition to her Trulicity, metformin and Invokana to help improve her control Also she was seen without dietitian to improve her diet which was significantly high in fat and calorie intake previously  Difficult to know if her blood sugars are better as she is not checking blood sugars much at all and fructosamine is still mildly increased Has not lost any weight as yet   PCOS: She continues to have some hirsutism, appears to have suppression of  testosterone with her birth control pills and 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  hypertension: Blood pressure is still borderline, may improve with lifestyle changes alone  She has had subclinical hypothyroidism, taking only 25 g levothyroxine without any clinical benefit, last TSH normal   PLAN:     Trial of Ozempic instead of Trulicity for better efficacy with blood sugar control and weight loss.  She will use a dose of 0.25 mg weekly for 2 weeks and then 0.5 mg weekly  If doing well she can call for a prescription after 5 weeks  Continue Amaryl 1 mg daily in the morning along with metformin ER and Invokana  Continue to follow her diet instructions given and exercise regularly  Can try the freestyle Libre sensor since she is currently not monitoring blood sugars much to help with identifying her blood sugar patterns and possibly helping her with compliance with diet also  Switch metformin to metformin ER since she cannot tolerate the dose of 1 g twice a day without nausea  Continued increase walking  PRAVASTATIN 40 mg daily for hyperlipidemia.  A1c in 2 months  May stop levothyroxine and will recheck thyroid on her next visit Consider ACE inhibitor for blood pressure regulation if blood pressure stays consistently high   There are no Patient Instructions on file for this visit.  Counseling time on subjects discussed above is over 50% of today's 25 minute visit   Raynold Blankenbaker 07/18/2016, 8:49 PM   Note: This office note was prepared with Dragon voice recognition system technology. Any transcriptional errors that result from this process are unintentional.

## 2016-08-22 ENCOUNTER — Telehealth: Payer: Self-pay | Admitting: Endocrinology

## 2016-08-22 NOTE — Telephone Encounter (Signed)
[facility] [address] [place], [place] - [address] [phone] (Phone) [phone] (Fax)   Patient is requesting a refill on the sample that Dr. Lucianne Muss gave her at her last visit that was to replace Dulaglutide (TRULICITY) 1.5 MG/0.5ML SOPN . Patient was not sure of the name. Patient stated that the medication did work well.   Please call patient to advise once rx is placed.

## 2016-08-23 ENCOUNTER — Other Ambulatory Visit: Payer: Self-pay

## 2016-08-23 MED ORDER — SEMAGLUTIDE(0.25 OR 0.5MG/DOS) 2 MG/1.5ML ~~LOC~~ SOPN: 0.5000 mg | PEN_INJECTOR | SUBCUTANEOUS | 3 refills | Status: DC

## 2016-08-23 NOTE — Telephone Encounter (Signed)
Spoke with patient and she stated an understanding 

## 2016-08-23 NOTE — Telephone Encounter (Signed)
Ozempic 0.5 mg weekly, one pen with 3 refills, remind her to use that co-pay card that I gave her after activating

## 2016-09-10 ENCOUNTER — Telehealth: Payer: Self-pay

## 2016-09-10 NOTE — Telephone Encounter (Signed)
Called patient to let her know that she can use her Copay Card to receive her Ozempic. Patient stated she had already picked up her Rx. Patient also stated that she needed her Lab appt and her appt with Dr. Lucianne Muss rescheduled and we have rescheduled for June.

## 2016-09-13 ENCOUNTER — Other Ambulatory Visit: Payer: Commercial Managed Care - PPO

## 2016-09-17 ENCOUNTER — Ambulatory Visit: Payer: Commercial Managed Care - PPO | Admitting: Endocrinology

## 2016-09-23 ENCOUNTER — Other Ambulatory Visit: Payer: Self-pay | Admitting: Endocrinology

## 2016-10-16 ENCOUNTER — Other Ambulatory Visit: Payer: Commercial Managed Care - PPO

## 2016-10-23 ENCOUNTER — Ambulatory Visit: Payer: Commercial Managed Care - PPO | Admitting: Endocrinology

## 2016-11-21 ENCOUNTER — Institutional Professional Consult (permissible substitution): Payer: Commercial Managed Care - PPO | Admitting: Pulmonary Disease

## 2016-12-26 ENCOUNTER — Other Ambulatory Visit: Payer: Self-pay | Admitting: Endocrinology

## 2017-01-17 ENCOUNTER — Telehealth: Payer: Self-pay | Admitting: Emergency Medicine

## 2017-01-17 NOTE — Telephone Encounter (Signed)
LMOVM advising patient she has not seen [place] in awhile. Please call the office to schedule an appointment if she wants to stay with [facility].

## 2017-01-22 ENCOUNTER — Other Ambulatory Visit: Payer: Self-pay | Admitting: Endocrinology

## 2017-02-13 ENCOUNTER — Institutional Professional Consult (permissible substitution): Payer: Commercial Managed Care - PPO | Admitting: Pulmonary Disease

## 2017-03-07 ENCOUNTER — Telehealth: Payer: Self-pay

## 2017-03-07 NOTE — Telephone Encounter (Signed)
Spoke to the patient today to discuss her making an appointment for f/u- Dr. Lucianne Muss will not do PA for Ozempic until she has made a f/u appointment she stated that she will call us back to make this appointment

## 2017-03-12 ENCOUNTER — Telehealth: Payer: Self-pay

## 2017-03-12 NOTE — Telephone Encounter (Signed)
Left vm requesting patient call back to schedule a follow up with labs prior before we can do the PA for ozempic per Dr. Lucianne Muss

## 2017-03-13 ENCOUNTER — Telehealth: Payer: Self-pay

## 2017-03-13 NOTE — Telephone Encounter (Signed)
This is my third attempt to reach out to the patient to get her scheduled for a f/u with Dr. Lucianne Muss with labs prior- patient has stated in previous conversations that she will call back to schedule but has not done so- I left another vm requesting she call back to schedule this do you want me to continue to contact patient please advise

## 2017-03-13 NOTE — Telephone Encounter (Signed)
Please send a letter, also mentioned that she may be dismissed if she does not make an appointment within the next 30 days

## 2017-03-14 ENCOUNTER — Encounter: Payer: Self-pay | Admitting: Endocrinology

## 2017-03-14 NOTE — Telephone Encounter (Signed)
Letter has been completed and mailed.

## 2017-03-14 NOTE — Telephone Encounter (Signed)
Can you please do this letter for patient? I am so far behind..... Thank you!

## 2017-03-14 NOTE — Telephone Encounter (Signed)
Can you please do this I will be working with Dr. Reece Agar on Monday

## 2017-05-02 ENCOUNTER — Ambulatory Visit: Payer: Commercial Managed Care - PPO | Admitting: Cardiology

## 2017-05-15 ENCOUNTER — Encounter: Payer: Self-pay | Admitting: *Deleted

## 2017-05-28 NOTE — Progress Notes (Deleted)
Cardiology Office Note   Date:  05/28/2017   ID:  Lydia Taylor, DOB 01-19-1976, MRN [id number]  PCP:  Lydia Merl, PA-C  Cardiologist:   No primary care provider on file. Referring:  ***  No chief complaint on file.     History of Present Illness: Lydia Taylor is a 42 y.o. female who is referred by Lydia Merl, PA-C for evaluation of *** presents for ***     Past Medical History:  Diagnosis Date  . Chicken pox   . Diabetes mellitus without complication (HCC)   . DM (diabetes mellitus), type 2 (HCC)   . Fibromyalgia   . Mood disorder (HCC)   . PCOS (polycystic ovarian syndrome)   . Sinus tachycardia     Past Surgical History:  Procedure Laterality Date  . BREAST REDUCTION SURGERY    . WISDOM TOOTH EXTRACTION       Current Outpatient Medications  Medication Sig Dispense Refill  . canagliflozin (INVOKANA) 300 MG TABS tablet Take 300 mg by mouth daily before breakfast.    . Continuous Blood Gluc Receiver (FREESTYLE LIBRE READER) DEVI 1 Device by Does not apply route as directed. 1 Device 0  . Continuous Blood Gluc Sensor (FREESTYLE LIBRE SENSOR SYSTEM) MISC 1 strip by Does not apply route once a week. Apply to upper arm and change sensor every 10 days 3 each 3  . diphenhydrAMINE (SOMINEX) 25 MG tablet Take 25 mg by mouth as needed.    . Dulaglutide (TRULICITY) 1.5 MG/0.5ML SOPN Inject 1.5 mg into the skin once a week.    . DULoxetine (CYMBALTA) 60 MG capsule Take 1 capsule (60 mg total) by mouth daily. 30 capsule 3  . gabapentin (NEURONTIN) 100 MG capsule Take 100 mg by mouth 3 (three) times daily. Take 1 tablet 3 times daily    . glimepiride (AMARYL) 1 MG tablet TAKE 1 TABLET BY MOUTH ONCE DAILY WITH BREAKFAST 90 tablet 0  . glucose blood (FREESTYLE INSULINX TEST) test strip Use as directed    . HYDROcodone-acetaminophen (NORCO) 10-325 MG tablet Take 1 tablet by mouth every 4 (four) hours as needed. 30 tablet 0  . levothyroxine (SYNTHROID,  LEVOTHROID) 25 MCG tablet Take 25 mcg by mouth daily before breakfast.    . metFORMIN (GLUCOPHAGE-XR) 500 MG 24 hr tablet TAKE FOUR TABLETS BY MOUTH ONCE DAILY WITH SUPPER 360 tablet 0  . Multiple Vitamins-Minerals (THERA-M) TABS Take 1 tablet by mouth daily.    . norethindrone (MICRONOR,CAMILA,ERRIN) 0.35 MG tablet Take 1 tablet by mouth daily.    . pravastatin (PRAVACHOL) 40 MG tablet TAKE 1 TABLET BY MOUTH ONCE DAILY 30 tablet 3  . Semaglutide (OZEMPIC) 0.25 or 0.5 MG/DOSE SOPN Inject 0.5 mg into the skin once a week. 1 pen 3   No current facility-administered medications for this visit.     Allergies:   Amoxicillin    Social History:  The patient  reports that  has never smoked. she has never used smokeless tobacco. She reports that she drinks alcohol. She reports that she does not use drugs.   Family History:  The patient's ***family history includes Diabetes in her father; Hyperlipidemia in her father; Hypertension in her father; Stroke in her maternal grandmother; Thyroid disease in her mother.    ROS:  Please see the history of present illness.   Otherwise, review of systems are positive for {NONE DEFAULTED:18576::"none"}.   All other systems are reviewed and negative.    PHYSICAL  EXAM: VS:  There were no vitals taken for this visit. , BMI There is no height or weight on file to calculate BMI. GENERAL:  Well appearing HEENT:  Pupils equal round and reactive, fundi not visualized, oral mucosa unremarkable NECK:  No jugular venous distention, waveform within normal limits, carotid upstroke brisk and symmetric, no bruits, no thyromegaly LYMPHATICS:  No cervical, inguinal adenopathy LUNGS:  Clear to auscultation bilaterally BACK:  No CVA tenderness CHEST:  Unremarkable HEART:  PMI not displaced or sustained,S1 and S2 within normal limits, no S3, no S4, no clicks, no rubs, *** murmurs ABD:  Flat, positive bowel sounds normal in frequency in pitch, no bruits, no rebound, no guarding,  no midline pulsatile mass, no hepatomegaly, no splenomegaly EXT:  2 plus pulses throughout, no edema, no cyanosis no clubbing SKIN:  No rashes no nodules NEURO:  Cranial nerves II through XII grossly intact, motor grossly intact throughout PSYCH:  Cognitively intact, oriented to person place and time    EKG:  EKG {ACTION; IS/IS UJW:[id number]} ordered today. The ekg ordered today demonstrates ***   Recent Labs: 06/21/2016: BUN 15; Creatinine, Ser 0.55; Potassium 4.1; Sodium 135; TSH 1.46    Lipid Panel    Component Value Date/Time   CHOL 202 (H) 06/21/2016 0910   TRIG 119.0 06/21/2016 0910   HDL 46.50 06/21/2016 0910   CHOLHDL 4 06/21/2016 0910   VLDL 23.8 06/21/2016 0910   LDLCALC 132 (H) 06/21/2016 0910      Wt Readings from Last 3 Encounters:  07/18/16 187 lb (84.8 kg)  06/13/16 187 lb (84.8 kg)  05/27/16 187 lb (84.8 kg)      Other studies Reviewed: Additional studies/ records that were reviewed today include: ***. Review of the above records demonstrates:  Please see elsewhere in the note.  ***   ASSESSMENT AND PLAN:  ***   Current medicines are reviewed at length with the patient today.  The patient {ACTIONS; HAS/DOES NOT HAVE:19233} concerns regarding medicines.  The following changes have been made:  {PLAN; NO CHANGE:13088:s}  Labs/ tests ordered today include: *** No orders of the defined types were placed in this encounter.    Disposition:   FU with ***    Signed, Rollene Rotunda, MD  05/28/2017 9:32 PM    [facility]

## 2017-05-30 ENCOUNTER — Ambulatory Visit: Payer: Commercial Managed Care - PPO | Admitting: Cardiology

## 2017-06-02 ENCOUNTER — Other Ambulatory Visit: Payer: Self-pay | Admitting: Endocrinology

## 2017-07-10 NOTE — Progress Notes (Deleted)
Cardiology Office Note   Date:  07/10/2017   ID:  Lydia Taylor, DOB January 14, 1976, MRN [id number]  PCP:  Waldon Merl, PA-C  Cardiologist:   No primary care provider on file. Referring:  ***  No chief complaint on file.     History of Present Illness: Lydia Taylor is a 42 y.o. female who is referred by *** for evaluation of tachycardia.  ***     Past Medical History:  Diagnosis Date  . Chicken pox   . Diabetes mellitus without complication (HCC)   . DM (diabetes mellitus), type 2 (HCC)   . Fibromyalgia   . Mood disorder (HCC)   . PCOS (polycystic ovarian syndrome)   . Sinus tachycardia     Past Surgical History:  Procedure Laterality Date  . BREAST REDUCTION SURGERY    . WISDOM TOOTH EXTRACTION       Current Outpatient Medications  Medication Sig Dispense Refill  . canagliflozin (INVOKANA) 300 MG TABS tablet Take 300 mg by mouth daily before breakfast.    . Continuous Blood Gluc Receiver (FREESTYLE LIBRE READER) DEVI 1 Device by Does not apply route as directed. 1 Device 0  . Continuous Blood Gluc Sensor (FREESTYLE LIBRE SENSOR SYSTEM) MISC 1 strip by Does not apply route once a week. Apply to upper arm and change sensor every 10 days 3 each 3  . diphenhydrAMINE (SOMINEX) 25 MG tablet Take 25 mg by mouth as needed.    . Dulaglutide (TRULICITY) 1.5 MG/0.5ML SOPN Inject 1.5 mg into the skin once a week.    . DULoxetine (CYMBALTA) 60 MG capsule Take 1 capsule (60 mg total) by mouth daily. 30 capsule 3  . gabapentin (NEURONTIN) 100 MG capsule Take 100 mg by mouth 3 (three) times daily. Take 1 tablet 3 times daily    . glimepiride (AMARYL) 1 MG tablet TAKE 1 TABLET BY MOUTH ONCE DAILY WITH BREAKFAST 90 tablet 0  . glucose blood (FREESTYLE INSULINX TEST) test strip Use as directed    . HYDROcodone-acetaminophen (NORCO) 10-325 MG tablet Take 1 tablet by mouth every 4 (four) hours as needed. 30 tablet 0  . levothyroxine (SYNTHROID, LEVOTHROID) 25 MCG tablet  Take 25 mcg by mouth daily before breakfast.    . metFORMIN (GLUCOPHAGE-XR) 500 MG 24 hr tablet TAKE 4 TABLETS BY MOUTH ONCE DAILY WITH SUPPER 360 tablet 0  . Multiple Vitamins-Minerals (THERA-M) TABS Take 1 tablet by mouth daily.    . norethindrone (MICRONOR,CAMILA,ERRIN) 0.35 MG tablet Take 1 tablet by mouth daily.    . pravastatin (PRAVACHOL) 40 MG tablet TAKE 1 TABLET BY MOUTH ONCE DAILY 30 tablet 3  . Semaglutide (OZEMPIC) 0.25 or 0.5 MG/DOSE SOPN Inject 0.5 mg into the skin once a week. 1 pen 3   No current facility-administered medications for this visit.     Allergies:   Amoxicillin    Social History:  The patient  reports that  has never smoked. she has never used smokeless tobacco. She reports that she drinks alcohol. She reports that she does not use drugs.   Family History:  The patient's ***family history includes Diabetes in her father; Hyperlipidemia in her father; Hypertension in her father; Stroke in her maternal grandmother; Thyroid disease in her mother.    ROS:  Please see the history of present illness.   Otherwise, review of systems are positive for {NONE DEFAULTED:18576::"none"}.   All other systems are reviewed and negative.    PHYSICAL EXAM: VS:  There  were no vitals taken for this visit. , BMI There is no height or weight on file to calculate BMI. GENERAL:  Well appearing HEENT:  Pupils equal round and reactive, fundi not visualized, oral mucosa unremarkable NECK:  No jugular venous distention, waveform within normal limits, carotid upstroke brisk and symmetric, no bruits, no thyromegaly LYMPHATICS:  No cervical, inguinal adenopathy LUNGS:  Clear to auscultation bilaterally BACK:  No CVA tenderness CHEST:  Unremarkable HEART:  PMI not displaced or sustained,S1 and S2 within normal limits, no S3, no S4, no clicks, no rubs, *** murmurs ABD:  Flat, positive bowel sounds normal in frequency in pitch, no bruits, no rebound, no guarding, no midline pulsatile mass,  no hepatomegaly, no splenomegaly EXT:  2 plus pulses throughout, no edema, no cyanosis no clubbing SKIN:  No rashes no nodules NEURO:  Cranial nerves II through XII grossly intact, motor grossly intact throughout PSYCH:  Cognitively intact, oriented to person place and time    EKG:  EKG {ACTION; IS/IS NFA:[id number]} ordered today. The ekg ordered today demonstrates ***   Recent Labs: No results found for requested labs within last 8760 hours.    Lipid Panel    Component Value Date/Time   CHOL 202 (H) 06/21/2016 0910   TRIG 119.0 06/21/2016 0910   HDL 46.50 06/21/2016 0910   CHOLHDL 4 06/21/2016 0910   VLDL 23.8 06/21/2016 0910   LDLCALC 132 (H) 06/21/2016 0910      Wt Readings from Last 3 Encounters:  07/18/16 187 lb (84.8 kg)  06/13/16 187 lb (84.8 kg)  05/27/16 187 lb (84.8 kg)      Other studies Reviewed: Additional studies/ records that were reviewed today include: ***. Review of the above records demonstrates:  Please see elsewhere in the note.  ***   ASSESSMENT AND PLAN:  TACHYCARDIA:  ***   Current medicines are reviewed at length with the patient today.  The patient {ACTIONS; HAS/DOES NOT HAVE:19233} concerns regarding medicines.  The following changes have been made:  {PLAN; NO CHANGE:13088:s}  Labs/ tests ordered today include: *** No orders of the defined types were placed in this encounter.    Disposition:   FU with ***    Signed, Rollene Rotunda, MD  07/10/2017 7:38 AM    [facility]

## 2017-07-11 ENCOUNTER — Ambulatory Visit: Payer: Commercial Managed Care - PPO | Admitting: Cardiology

## 2017-07-24 ENCOUNTER — Telehealth: Payer: Self-pay | Admitting: Cardiology

## 2017-07-24 NOTE — Telephone Encounter (Signed)
Closed Encounter  °

## 2017-08-10 NOTE — Progress Notes (Deleted)
Cardiology Office Note   Date:  08/10/2017   ID:  Lydia Taylor, DOB 02-23-1976, MRN [id number]  PCP:  Lydia Merl, PA-C  Cardiologist:   No primary care provider on file. Referring:  ***  No chief complaint on file.     History of Present Illness: Lydia Taylor is a 42 y.o. female who was referred by *** for evaluation of ***    Past Medical History:  Diagnosis Date  . Chicken pox   . Diabetes mellitus without complication (HCC)   . DM (diabetes mellitus), type 2 (HCC)   . Fibromyalgia   . Mood disorder (HCC)   . PCOS (polycystic ovarian syndrome)   . Sinus tachycardia     Past Surgical History:  Procedure Laterality Date  . BREAST REDUCTION SURGERY    . WISDOM TOOTH EXTRACTION       Current Outpatient Medications  Medication Sig Dispense Refill  . canagliflozin (INVOKANA) 300 MG TABS tablet Take 300 mg by mouth daily before breakfast.    . Continuous Blood Gluc Receiver (FREESTYLE LIBRE READER) DEVI 1 Device by Does not apply route as directed. 1 Device 0  . Continuous Blood Gluc Sensor (FREESTYLE LIBRE SENSOR SYSTEM) MISC 1 strip by Does not apply route once a week. Apply to upper arm and change sensor every 10 days 3 each 3  . diphenhydrAMINE (SOMINEX) 25 MG tablet Take 25 mg by mouth as needed.    . Dulaglutide (TRULICITY) 1.5 MG/0.5ML SOPN Inject 1.5 mg into the skin once a week.    . DULoxetine (CYMBALTA) 60 MG capsule Take 1 capsule (60 mg total) by mouth daily. 30 capsule 3  . gabapentin (NEURONTIN) 100 MG capsule Take 100 mg by mouth 3 (three) times daily. Take 1 tablet 3 times daily    . glimepiride (AMARYL) 1 MG tablet TAKE 1 TABLET BY MOUTH ONCE DAILY WITH BREAKFAST 90 tablet 0  . glucose blood (FREESTYLE INSULINX TEST) test strip Use as directed    . HYDROcodone-acetaminophen (NORCO) 10-325 MG tablet Take 1 tablet by mouth every 4 (four) hours as needed. 30 tablet 0  . levothyroxine (SYNTHROID, LEVOTHROID) 25 MCG tablet Take 25 mcg by  mouth daily before breakfast.    . metFORMIN (GLUCOPHAGE-XR) 500 MG 24 hr tablet TAKE 4 TABLETS BY MOUTH ONCE DAILY WITH SUPPER 360 tablet 0  . Multiple Vitamins-Minerals (THERA-M) TABS Take 1 tablet by mouth daily.    . norethindrone (MICRONOR,CAMILA,ERRIN) 0.35 MG tablet Take 1 tablet by mouth daily.    . pravastatin (PRAVACHOL) 40 MG tablet TAKE 1 TABLET BY MOUTH ONCE DAILY 30 tablet 3  . Semaglutide (OZEMPIC) 0.25 or 0.5 MG/DOSE SOPN Inject 0.5 mg into the skin once a week. 1 pen 3   No current facility-administered medications for this visit.     Allergies:   Amoxicillin    Social History:  The patient  reports that she has never smoked. She has never used smokeless tobacco. She reports that she drinks alcohol. She reports that she does not use drugs.   Family History:  The patient's ***family history includes Diabetes in her father; Hyperlipidemia in her father; Hypertension in her father; Stroke in her maternal grandmother; Thyroid disease in her mother.    ROS:  Please see the history of present illness.   Otherwise, review of systems are positive for {NONE DEFAULTED:18576::"none"}.   All other systems are reviewed and negative.    PHYSICAL EXAM: VS:  There were no vitals  taken for this visit. , BMI There is no height or weight on file to calculate BMI. GENERAL:  Well appearing HEENT:  Pupils equal round and reactive, fundi not visualized, oral mucosa unremarkable NECK:  No jugular venous distention, waveform within normal limits, carotid upstroke brisk and symmetric, no bruits, no thyromegaly LYMPHATICS:  No cervical, inguinal adenopathy LUNGS:  Clear to auscultation bilaterally BACK:  No CVA tenderness CHEST:  Unremarkable HEART:  PMI not displaced or sustained,S1 and S2 within normal limits, no S3, no S4, no clicks, no rubs, *** murmurs ABD:  Flat, positive bowel sounds normal in frequency in pitch, no bruits, no rebound, no guarding, no midline pulsatile mass, no  hepatomegaly, no splenomegaly EXT:  2 plus pulses throughout, no edema, no cyanosis no clubbing SKIN:  No rashes no nodules NEURO:  Cranial nerves II through XII grossly intact, motor grossly intact throughout PSYCH:  Cognitively intact, oriented to person place and time    EKG:  EKG {ACTION; IS/IS AGT:[id number]} ordered today. The ekg ordered today demonstrates ***   Recent Labs: No results found for requested labs within last 8760 hours.    Lipid Panel    Component Value Date/Time   CHOL 202 (H) 06/21/2016 0910   TRIG 119.0 06/21/2016 0910   HDL 46.50 06/21/2016 0910   CHOLHDL 4 06/21/2016 0910   VLDL 23.8 06/21/2016 0910   LDLCALC 132 (H) 06/21/2016 0910      Wt Readings from Last 3 Encounters:  07/18/16 187 lb (84.8 kg)  06/13/16 187 lb (84.8 kg)  05/27/16 187 lb (84.8 kg)      Other studies Reviewed: Additional studies/ records that were reviewed today include: ***. Review of the above records demonstrates:  Please see elsewhere in the note.  ***   ASSESSMENT AND PLAN:  SINUS TACHYCARDIA:  ***   Current medicines are reviewed at length with the patient today.  The patient {ACTIONS; HAS/DOES NOT HAVE:19233} concerns regarding medicines.  The following changes have been made:  {PLAN; NO CHANGE:13088:s}  Labs/ tests ordered today include: *** No orders of the defined types were placed in this encounter.    Disposition:   FU with ***    Signed, Rollene Rotunda, MD  08/10/2017 8:58 PM    [facility]

## 2017-08-12 ENCOUNTER — Ambulatory Visit: Payer: Commercial Managed Care - PPO | Admitting: Cardiology

## 2017-08-14 ENCOUNTER — Ambulatory Visit: Payer: Commercial Managed Care - PPO | Admitting: Cardiology

## 2017-08-20 ENCOUNTER — Ambulatory Visit: Payer: Commercial Managed Care - PPO | Admitting: Cardiovascular Disease

## 2017-08-20 ENCOUNTER — Encounter: Payer: Self-pay | Admitting: Cardiovascular Disease

## 2017-08-20 DIAGNOSIS — R000 Tachycardia, unspecified: Secondary | ICD-10-CM | POA: Diagnosis not present

## 2017-08-20 DIAGNOSIS — I4711 Inappropriate sinus tachycardia, so stated: Secondary | ICD-10-CM | POA: Insufficient documentation

## 2017-08-20 DIAGNOSIS — E7800 Pure hypercholesterolemia, unspecified: Secondary | ICD-10-CM

## 2017-08-20 DIAGNOSIS — E785 Hyperlipidemia, unspecified: Secondary | ICD-10-CM | POA: Insufficient documentation

## 2017-08-20 NOTE — Progress Notes (Signed)
08/20/2017 Lydia Taylor   08-27-1975  [id number]  Primary Physician Dois Davenport, MD Primary Cardiologist: Runell Gess MD Nicholes Calamity, [place]  HPI:  Lydia Taylor is a 42 y.o. moderately overweight married Caucasian female mother of 2 adopted children who was referred by Dr. Hal Hope for cardiovascular evaluation because of inappropriate sinus tachycardia. 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 Her cardiac risk factors include treated diabetes and hyperlipidemia. She has never had a heart attack or stroke. She denies chest pain or shortness of breath. There is no family history of heart disease. Her EKG shows sinus tachycardia which is inappropriate. Laboratory exam is the most part normal. She is asymptomatic from this.    Current Meds  Medication Sig  . canagliflozin (INVOKANA) 300 MG TABS tablet Take 300 mg by mouth daily before breakfast.  . Continuous Blood Gluc Receiver (FREESTYLE LIBRE READER) DEVI 1 Device by Does not apply route as directed.  . Continuous Blood Gluc Sensor (FREESTYLE LIBRE SENSOR SYSTEM) MISC 1 strip by Does not apply route once a week. Apply to upper arm and change sensor every 10 days  . glimepiride (AMARYL) 1 MG tablet TAKE 1 TABLET BY MOUTH ONCE DAILY WITH BREAKFAST  . glucose blood (FREESTYLE INSULINX TEST) test strip Use as directed  . levothyroxine (SYNTHROID, LEVOTHROID) 25 MCG tablet Take 25 mcg by mouth daily before breakfast.  . metFORMIN (GLUCOPHAGE-XR) 500 MG 24 hr tablet TAKE 4 TABLETS BY MOUTH ONCE DAILY WITH SUPPER  . norethindrone (MICRONOR,CAMILA,ERRIN) 0.35 MG tablet Take 1 tablet by mouth daily.  . pravastatin (PRAVACHOL) 40 MG tablet TAKE 1 TABLET BY MOUTH ONCE DAILY     Allergies  Allergen Reactions  . Amoxicillin Rash    Social History   Socioeconomic History  . Marital status: Married    Spouse name: Not on file  . Number of children: Not on file  . Years of education: Not on file  . Highest  education level: Not on file  Occupational History  . Occupation: Theatre manager  Social Needs  . Financial resource strain: Not on file  . Food insecurity:    Worry: Not on file    Inability: Not on file  . Transportation needs:    Medical: Not on file    Non-medical: Not on file  Tobacco Use  . Smoking status: Never Smoker  . Smokeless tobacco: Never Used  Substance and Sexual Activity  . Alcohol use: Yes    Alcohol/week: 0.0 oz    Comment: socially  . Drug use: No  . Sexual activity: Yes    Partners: Male  Lifestyle  . Physical activity:    Days per week: Not on file    Minutes per session: Not on file  . Stress: Not on file  Relationships  . Social connections:    Talks on phone: Not on file    Gets together: Not on file    Attends religious service: Not on file    Active member of club or organization: Not on file    Attends meetings of clubs or organizations: Not on file    Relationship status: Not on file  . Intimate partner violence:    Fear of current or ex partner: Not on file    Emotionally abused: Not on file    Physically abused: Not on file    Forced sexual activity: Not on file  Other Topics Concern  . Not on file  Social  History Narrative  . Not on file     Review of Systems: General: negative for chills, fever, night sweats or weight changes.  Cardiovascular: negative for chest pain, dyspnea on exertion, edema, orthopnea, palpitations, paroxysmal nocturnal dyspnea or shortness of breath Dermatological: negative for rash Respiratory: negative for cough or wheezing Urologic: negative for hematuria Abdominal: negative for nausea, vomiting, diarrhea, bright red blood per rectum, melena, or hematemesis Neurologic: negative for visual changes, syncope, or dizziness All other systems reviewed and are otherwise negative except as noted above.    Blood pressure 120/88, pulse (!) 116, height 5\' 3"  (1.6 m), weight 191 lb (86.6 kg).  General appearance:  alert and no distress Neck: no adenopathy, no carotid bruit, no JVD, supple, symmetrical, trachea midline and thyroid not enlarged, symmetric, no tenderness/mass/nodules Lungs: clear to auscultation bilaterally Heart: regular rate and rhythm, S1, S2 normal, no murmur, click, rub or gallop Extremities: extremities normal, atraumatic, no cyanosis or edema Pulses: 2+ and symmetric Skin: Skin color, texture, turgor normal. No rashes or lesions Neurologic: Alert and oriented X 3, normal strength and tone. Normal symmetric reflexes. Normal coordination and gait  EKG sinus tachycardia at 116 borderline LVH and nonspecific ST and T-wave changes. I personally reviewed this EKG.  ASSESSMENT AND PLAN:   Inappropriate sinus tachycardia Lydia Taylor was referred to be by Dr. Hal Hope for inappropriate sinus tachycardia.she is unaware that her heart rate is elevated and is otherwise asymptomatic. Her blood work has been thoroughly evaluated by Dr. Hal Hope including thyroid function tests without significant abnormalities to explain her high heart rate. I am going to get a 2-D echo to rule out LV dysfunction as a cause. If her LV function is normal I do not think that treating her elevated heart rate at this time is necessary.  Hyperlipidemia History of hyperlipidemia on pravastatin 40 mg a day with a lipid profile performed by her PCP recently that showed an LDL in the 120 range. I do not think I would increase her pravastatin to 40 at this point but have emphasized a heart healthy diet.      Runell Gess MD FACP,FACC,FAHA, [facility] 08/20/2017 2:08 PM

## 2017-08-20 NOTE — Assessment & Plan Note (Signed)
History of hyperlipidemia on pravastatin 40 mg a day with a lipid profile performed by her PCP recently that showed an LDL in the 120 range. I do not think I would increase her pravastatin to 40 at this point but have emphasized a heart healthy diet.

## 2017-08-20 NOTE — Patient Instructions (Signed)
Medication Instructions:  Your physician recommends that you continue on your current medications as directed. Please refer to the Current Medication list given to you today.   Labwork: none  Testing/Procedures: Your physician has requested that you have an echocardiogram. Echocardiography is a painless test that uses sound waves to create images of your heart. It provides your doctor with information about the size and shape of your heart and how well your heart's chambers and valves are working. This procedure takes approximately one hour. There are no restrictions for this procedure.    Follow-Up: Follow up with Dr. Berry as needed.   Any Other Special Instructions Will Be Listed Below (If Applicable).     If you need a refill on your cardiac medications before your next appointment, please call your pharmacy.   

## 2017-08-20 NOTE — Assessment & Plan Note (Signed)
Lydia Taylor was referred to be by Dr. Hal Hope for inappropriate sinus tachycardia.she is unaware that her heart rate is elevated and is otherwise asymptomatic. Her blood work has been thoroughly evaluated by Dr. Hal Hope including thyroid function tests without significant abnormalities to explain her high heart rate. I am going to get a 2-D echo to rule out LV dysfunction as a cause. If her LV function is normal I do not think that treating her elevated heart rate at this time is necessary.

## 2017-09-03 ENCOUNTER — Encounter: Payer: Self-pay | Admitting: Internal Medicine

## 2017-09-03 ENCOUNTER — Ambulatory Visit (INDEPENDENT_AMBULATORY_CARE_PROVIDER_SITE_OTHER): Payer: Commercial Managed Care - PPO | Admitting: Internal Medicine

## 2017-09-03 ENCOUNTER — Other Ambulatory Visit: Payer: Self-pay

## 2017-09-03 ENCOUNTER — Ambulatory Visit (HOSPITAL_COMMUNITY): Payer: Commercial Managed Care - PPO | Attending: Cardiovascular Disease

## 2017-09-03 VITALS — BP 119/94

## 2017-09-03 VITALS — BP 116/78 | HR 67 | Ht 63.0 in | Wt 193.0 lb

## 2017-09-03 DIAGNOSIS — E7800 Pure hypercholesterolemia, unspecified: Secondary | ICD-10-CM

## 2017-09-03 DIAGNOSIS — I428 Other cardiomyopathies: Secondary | ICD-10-CM

## 2017-09-03 DIAGNOSIS — R000 Tachycardia, unspecified: Secondary | ICD-10-CM | POA: Diagnosis not present

## 2017-09-03 DIAGNOSIS — I5022 Chronic systolic (congestive) heart failure: Secondary | ICD-10-CM

## 2017-09-03 DIAGNOSIS — I517 Cardiomegaly: Secondary | ICD-10-CM | POA: Insufficient documentation

## 2017-09-03 MED ORDER — FUROSEMIDE 20 MG PO TABS: 20.0000 mg | ORAL_TABLET | Freq: Every day | ORAL | 3 refills | Status: DC

## 2017-09-03 MED ORDER — PERFLUTREN LIPID MICROSPHERE
1.0000 mL | INTRAVENOUS | Status: AC | PRN
  Administered 2017-09-03: 1 mL via INTRAVENOUS

## 2017-09-03 MED ORDER — BISOPROLOL FUMARATE 5 MG PO TABS: 2.5000 mg | ORAL_TABLET | Freq: Every day | ORAL | 3 refills | Status: DC

## 2017-09-03 NOTE — Patient Instructions (Addendum)
Medication Instructions:  Your physician has recommended you make the following change in your medication:   1. Begin lasix 40mg , two tablets per day for 3 days, then begin Lasix 20mg , one tablet daily  2. Begin Bisoprolol 2.5mg  half tablet, once daily  Labwork: None ordered.  Testing/Procedures: None ordered.  Follow-Up:  Please contact us with what you decide to do for follow up.  Any Other Special Instructions Will Be Listed Below (If Applicable).     If you need a refill on your cardiac medications before your next appointment, please call your pharmacy.

## 2017-09-03 NOTE — Progress Notes (Signed)
Cardiology Office NOTE  Patient ID: Lydia Taylor, MRN: [id number], DOB/AGE: [birth year] 42 y.o. Admit date: (Not on file) Date of Consult: 09/03/2017  Primary Physician: Dois Davenport, MD Primary Cardiologist: SIDNEY SILBERMAN is a 42 y.o. female who is being seen today for the evaluation of sinus tachycardia and newly identified cardiomyopathy.    HPI Lydia Taylor is a 42 y.o. female  Seen today as an add-on following an echocardiogram obtained because of sinus tachycardia which demonstrated an ejection fraction of 20-25%.  She has a long-standing history of significant fatigue.  This dates back 3 or 4 years.  Her husband has noted increasing shortness of breath and increasing fatigue.  She does not carry groceries.  She does not go to the store by herself.  Part of it is related to pain and she carries a diagnosis of fibromyalgia.  Other parts of it are related to shortness of breath and fatigue.  She has paroxysmal nocturnal dyspnea.  She has not had orthopnea.  Does not have early satiety.  She has not had palpitations.  She is known to have rapid heart rates dating back at least 3 or 4 years.  She was started on Bystolic for a short period of time; it was stopped for unclear reasons.  Unrelated to this she has had problems with alopecia.  In this regard it is noteworthy that her TSH was normal a year ago.      Past Medical History:  Diagnosis Date  . Chicken pox   . Diabetes mellitus without complication (HCC)   . DM (diabetes mellitus), type 2 (HCC)   . Fibromyalgia   . Mood disorder (HCC)   . PCOS (polycystic ovarian syndrome)   . Sinus tachycardia       Surgical History:  Past Surgical History:  Procedure Laterality Date  . BREAST REDUCTION SURGERY    . WISDOM TOOTH EXTRACTION       Home Meds: Prior to Admission medications   Medication Sig Start Date End Date Taking? Authorizing Provider  canagliflozin (INVOKANA) 300 MG TABS tablet  Take 300 mg by mouth daily before breakfast.   Yes [provider]  clonazePAM (KLONOPIN) 0.5 MG tablet Take 0.5 mg by mouth 2 (two) times daily as needed for anxiety.   Yes [provider]  Continuous Blood Gluc Receiver (FREESTYLE LIBRE READER) DEVI 1 Device by Does not apply route as directed. 07/18/16  Yes Reather Littler, MD  Continuous Blood Gluc Sensor (FREESTYLE LIBRE SENSOR SYSTEM) MISC 1 strip by Does not apply route once a week. Apply to upper arm and change sensor every 10 days 07/18/16  Yes Reather Littler, MD  glimepiride (AMARYL) 1 MG tablet TAKE 1 TABLET BY MOUTH ONCE DAILY WITH BREAKFAST 01/22/17  Yes Reather Littler, MD  glucose blood (FREESTYLE INSULINX TEST) test strip Use as directed 11/10/12  Yes [provider]  levothyroxine (SYNTHROID, LEVOTHROID) 25 MCG tablet Take 25 mcg by mouth daily before breakfast.   Yes [provider]  metFORMIN (GLUCOPHAGE-XR) 500 MG 24 hr tablet TAKE 4 TABLETS BY MOUTH ONCE DAILY WITH SUPPER 06/02/17  Yes Reather Littler, MD  norethindrone (MICRONOR,CAMILA,ERRIN) 0.35 MG tablet Take 1 tablet by mouth daily.   Yes [provider]  pravastatin (PRAVACHOL) 40 MG tablet TAKE 1 TABLET BY MOUTH ONCE DAILY 12/27/16  Yes Reather Littler, MD    Allergies:  Allergies  Allergen Reactions  . Amoxicillin Rash  Social History   Socioeconomic History  . Marital status: Married    Spouse name: Not on file  . Number of children: Not on file  . Years of education: Not on file  . Highest education level: Not on file  Occupational History  . Occupation: Theatre manager  Social Needs  . Financial resource strain: Not on file  . Food insecurity:    Worry: Not on file    Inability: Not on file  . Transportation needs:    Medical: Not on file    Non-medical: Not on file  Tobacco Use  . Smoking status: Never Smoker  . Smokeless tobacco: Never Used  Substance and Sexual Activity  . Alcohol use: Yes    Alcohol/week: 0.0 oz     Comment: socially  . Drug use: No  . Sexual activity: Yes    Partners: Male  Lifestyle  . Physical activity:    Days per week: Not on file    Minutes per session: Not on file  . Stress: Not on file  Relationships  . Social connections:    Talks on phone: Not on file    Gets together: Not on file    Attends religious service: Not on file    Active member of club or organization: Not on file    Attends meetings of clubs or organizations: Not on file    Relationship status: Not on file  . Intimate partner violence:    Fear of current or ex partner: Not on file    Emotionally abused: Not on file    Physically abused: Not on file    Forced sexual activity: Not on file  Other Topics Concern  . Not on file  Social History Narrative  . Not on file     Family History  Problem Relation Age of Onset  . Thyroid disease Mother   . Diabetes Father   . Hypertension Father   . Hyperlipidemia Father   . Stroke Maternal Grandmother      ROS:  Please see the history of present illness.     All other systems reviewed and negative.    Physical Exam  Blood pressure 116/78, pulse 67, height 5\' 3"  (1.6 m), weight 193 lb (87.5 kg), SpO2 99 %. General: Well developed, well nourished female in no acute distress. Head: Normocephalic, atraumatic, sclera non-icteric, no xanthomas, nares are without discharge. EENT: normal  Lymph Nodes:  none Neck: Negative for carotid bruits. JVD 8-10 Back:without scoliosis kyphosis Lungs: Clear bilaterally to auscultation without wheezes, rales, or rhonchi. Breathing is unlabored. Heart: RRR with S1 S2. 2/6 systolic murmur . No rubs, or gallops appreciated. Abdomen: Soft, non-tender, non-distended with normoactive bowel sounds. No hepatomegaly. No rebound/guarding. No obvious abdominal masses. Msk:  Strength and tone appear normal for age. Extremities: No clubbing or cyanosis. No edema.  Distal pedal pulses are 2+ and equal bilaterally. Skin: Warm and  Dry Neuro: Alert and oriented X 3. CN III-XII intact Grossly normal sensory and motor function . Psych:  Responds to questions appropriately with a normal affect.      Labs: Cardiac Enzymes No results for input(s): CKTOTAL, CKMB, TROPONINI in the last 72 hours. CBC Lab Results  Component Value Date   WBC 6.7 05/31/2015   HGB 15.4 (H) 05/31/2015   HCT 46.2 (H) 05/31/2015   MCV 86.9 05/31/2015   PLT 473.0 (H) 05/31/2015   PROTIME: No results for input(s): LABPROT, INR in the last 72 hours. Chemistry No results for  input(s): NA, K, CL, CO2, BUN, CREATININE, CALCIUM, PROT, BILITOT, ALKPHOS, ALT, AST, GLUCOSE in the last 168 hours.  Invalid input(s): LABALBU Lipids Lab Results  Component Value Date   CHOL 202 (H) 06/21/2016   HDL 46.50 06/21/2016   LDLCALC 132 (H) 06/21/2016   TRIG 119.0 06/21/2016   BNP No results found for: PROBNP Thyroid Function Tests: No results for input(s): TSH, T4TOTAL, T3FREE, THYROIDAB in the last 72 hours.  Invalid input(s): FREET3 Miscellaneous No results found for: DDIMER  Radiology/Studies:  No results found.  EKG: sinus with p waves similar to sinus   Assessment and Plan:   Cardiomyopathy?  Mechanism  Sinus tachycardia  Congestive heart failure class IIb-IIIa  Diabetes  PCOS    The patient has a newly identified cardiomyopathy by echo.  This was undertaken for sinus tachycardia.  As best as we know his tachycardia is been going on for 3-4+ years.  P wave morphologies are consistent with a sinus mechanism.  Lowest heart rate detected in the old record is about 95.  It is unlikely that the tachycardia is the cause of her cardiomyopathy.  And atrial tachycardia might well do this, and a Holter monitor might be of value to look at the heart rate excursion over the day to exclude this although the P wave morphologies are not  strongly suggestive of the tachycardia mechanism other than sinus.  She was on a beta-blocker some years  ago for a brief period of time with a significant change in her heart rate.  We have discussed the implications of cardiomyopathy.  Her husband asked me to question what happens if she does takes no medications.  Asking him whether he wanted a hard answer or the soft answer he said the hard and I quoted him a 50% 1 year mortality with an ejection fraction of 20%.  We will begin her on bisoprolol.  Her blood pressure is not excessively high, and we may get more room for alternative medications without the alpha blocking effects of carvedilol.  She is modestly volume overloaded.  We will put her on furosemide.  This will hopefully help with her dyspnea on exertion as well as her nocturnal dyspnea.  She is not sure at this point where she would like to follow-up with i.e. [facility], Dr. Gery Pray, advanced heart failure,   she has multiple cardiac risk factors with diabetes and PCOS.  I would have a low threshold for catheterization to exclude coronary disease as a contributing factor to her current myopathy  We will be in touch with them next week for their decision.  I have reached out to Dr. Allyson Sabal but have not yet heard back   Sherryl Manges

## 2017-09-08 ENCOUNTER — Telehealth: Payer: Self-pay | Admitting: Internal Medicine

## 2017-09-08 NOTE — Telephone Encounter (Signed)
Pt calling: is stating she want to pursue with the CHF clinic per her conversation with Dr Graciela Husbands. Pt want to know what she need to do to get an appt with the CHF clinic.

## 2017-09-10 ENCOUNTER — Encounter: Payer: Self-pay | Admitting: Cardiovascular Disease

## 2017-09-10 ENCOUNTER — Ambulatory Visit: Payer: Commercial Managed Care - PPO | Admitting: Cardiovascular Disease

## 2017-09-10 VITALS — BP 104/82 | HR 107 | Ht 63.0 in | Wt 188.0 lb

## 2017-09-10 DIAGNOSIS — I519 Heart disease, unspecified: Secondary | ICD-10-CM | POA: Diagnosis not present

## 2017-09-10 DIAGNOSIS — R000 Tachycardia, unspecified: Secondary | ICD-10-CM | POA: Diagnosis not present

## 2017-09-10 LAB — BASIC METABOLIC PANEL
BUN/Creatinine Ratio: 17 (ref 9–23)
BUN: 11 mg/dL (ref 6–24)
CHLORIDE: 104 mmol/L (ref 96–106)
CO2: 18 mmol/L — ABNORMAL LOW (ref 20–29)
CREATININE: 0.65 mg/dL (ref 0.57–1.00)
Calcium: 9.5 mg/dL (ref 8.7–10.2)
GFR calc non Af Amer: 111 mL/min/{1.73_m2} (ref >59)
GFR, EST AFRICAN AMERICAN: 128 mL/min/{1.73_m2} (ref >59)
Glucose: 229 mg/dL — ABNORMAL HIGH (ref 65–99)
Potassium: 4.8 mmol/L (ref 3.5–5.2)
SODIUM: 139 mmol/L (ref 134–144)

## 2017-09-10 MED ORDER — BISOPROLOL FUMARATE 5 MG PO TABS: 5.0000 mg | ORAL_TABLET | Freq: Every day | ORAL | 6 refills | Status: DC

## 2017-09-10 NOTE — Telephone Encounter (Signed)
appt scheduled

## 2017-09-10 NOTE — Patient Instructions (Signed)
Medication Instructions: Your physician recommends that you continue on your current medications as directed. Please refer to the Current Medication list given to you today.  Increase Zebeta to 5 mg (1 tab) daily.   Labwork: Your physician recommends that you return for lab work today--BMET   Testing/Procedures: Your physician has recommended that you wear a 48 hr holter monitor. Holter monitors are medical devices that record the heart's electrical activity. Doctors most often use these monitors to diagnose arrhythmias. Arrhythmias are problems with the speed or rhythm of the heartbeat. The monitor is a small, portable device. You can wear one while you do your normal daily activities. This is usually used to diagnose what is causing palpitations/syncope (passing out).  Your physician has requested that you have an echocardiogram in 3 months. Echocardiography is a painless test that uses sound waves to create images of your heart. It provides your doctor with information about the size and shape of your heart and how well your heart's chambers and valves are working. This procedure takes approximately one hour. There are no restrictions for this procedure.   Coronary CTA:  Please arrive at the [facility] main entrance of [facility] at               AM (30-45 minutes prior to test start time)  [facility] [address] [place], [place] [address] [phone]  Proceed to the [facility] Radiology Department (First Floor).  Please follow these instructions carefully (unless otherwise directed):  On the Night Before the Test: . Drink plenty of water. . Do not consume any caffeinated/decaffeinated beverages or chocolate 12 hours prior to your test. . Do not take any antihistamines 12 hours prior to your test. . If you take Metformin do not take 24 hours prior to test.  On the Day of the Test: . Drink plenty of water. Do not drink any water within one hour of  the test. . Do not eat any food 4 hours prior to the test. . You may take your regular medications prior to the test. . HOLD Furosemide morning of the test.  After the Test: . Drink plenty of water. . After receiving IV contrast, you may experience a mild flushed feeling. This is normal. . On occasion, you may experience a mild rash up to 24 hours after the test. This is not dangerous. If this occurs, you can take Benadryl 25 mg and increase your fluid intake. . If you experience trouble breathing, this can be serious. If it is severe call 911 IMMEDIATELY. If it is mild, please call our office. . If you take any of these medications: Glipizide/Metformin, Avandament, Glucavance, please do not take 48 hours after completing test.   Follow-Up: You have been referred to the Heart Failure Clinic.  Your physician recommends that you schedule a follow-up appointment in: 3 months with Dr. Allyson Sabal after your echo.  If you need a refill on your cardiac medications before your next appointment, please call your pharmacy.

## 2017-09-10 NOTE — Progress Notes (Signed)
Ms. Desmidt returns today for follow-up. A 2-D echo which I ordered repeat revealed surprisingly ejection fraction of 20% with a severely dilated left ventricle and grade 3 diastolic dysfunction. I suspect this is chronic. She did see Dr. Graciela Husbands in the office for review of tests. He began her on a low-dose diuretic and beta blocker which I'm going to titrate.I am going to get a coronary CTA to rule out ischemic etiology and referred her to the heart failure clinic. I'm also going to repeat a 2-D echo in 3 months to determine whether she's had improvement in LV function and if not we'll refer her back to Dr. Graciela Husbands for consideration of ICD implantation for primary prevention.  Runell Gess, M.D., FACP, [facility], [facility] [address] [place], [place]  [address]  [phone] 09/10/2017 11:17 AM

## 2017-09-10 NOTE — Assessment & Plan Note (Signed)
Ms. Oren's heart rate remains below 100 range. She was begun on low dose Zebeta which we'll titrate up to 5 mg a day. She did see Dr. Graciela Husbands in the office for evaluation of this and her low EF. Is unclear whether this is sinus tachycardia or a high atrial tachycardia. At his suggestion I am going to get 48 hour Holter monitor to look for diurnal variation.

## 2017-09-10 NOTE — Assessment & Plan Note (Signed)
Lydia Taylor returns here for follow-up. Her 2-D echo revealed EF of 20% which is unexpected finding. Her cavity size with severely dilated suggesting this is a chronic process. She had grade 3 diastolic dysfunction. She was started on low-dose beta blocker but she still tachycardic. She was also started on low-dose diuretic. The etiology of her LV dysfunction is still unclear. I am going to a coronary CTA to rule out coronary artery disease.we'll recheck a 2-D echo in 3 months. If her EF remains depressed she will refer back to Dr. Graciela Husbands for consideration of ICD implantation for primary prevention of sudden cardiac death.

## 2017-09-15 ENCOUNTER — Encounter (HOSPITAL_COMMUNITY): Payer: Self-pay | Admitting: *Deleted

## 2017-09-15 ENCOUNTER — Ambulatory Visit (HOSPITAL_COMMUNITY): Payer: Commercial Managed Care - PPO

## 2017-09-15 NOTE — Progress Notes (Unsigned)
Lydia Taylor arrived for an echocardiogram 09-15-17 which appears to have been scheduled prematurely.  Dr. Hazle Coca Office note asks for the CT now, and an echo to be repeated in 3 months (last echo 09-03-17), which would be the end of July.  I discussed this all with Lydia Taylor and she agrees, so I escorted her to checkout to be rescheduled for the Echo in July.  (CT is awaiting pre-cert and will be scheduled accordingly).    Farrel Conners, RDCS

## 2017-09-22 ENCOUNTER — Other Ambulatory Visit: Payer: Self-pay | Admitting: Cardiovascular Disease

## 2017-09-22 ENCOUNTER — Ambulatory Visit (INDEPENDENT_AMBULATORY_CARE_PROVIDER_SITE_OTHER): Payer: Commercial Managed Care - PPO

## 2017-09-22 DIAGNOSIS — I519 Heart disease, unspecified: Secondary | ICD-10-CM

## 2017-09-22 DIAGNOSIS — R000 Tachycardia, unspecified: Secondary | ICD-10-CM

## 2017-09-25 ENCOUNTER — Ambulatory Visit (HOSPITAL_COMMUNITY): Payer: Commercial Managed Care - PPO

## 2017-09-25 ENCOUNTER — Ambulatory Visit (HOSPITAL_COMMUNITY)
Admission: RE | Admit: 2017-09-25 | Discharge: 2017-09-25 | Disposition: A | Discharge status: Home / Self Care | Payer: Commercial Managed Care - PPO | Source: Ambulatory Visit | Attending: Cardiovascular Disease | Admitting: Cardiovascular Disease

## 2017-09-25 DIAGNOSIS — I519 Heart disease, unspecified: Secondary | ICD-10-CM | POA: Diagnosis not present

## 2017-09-25 DIAGNOSIS — I429 Cardiomyopathy, unspecified: Secondary | ICD-10-CM | POA: Diagnosis not present

## 2017-09-25 MED ORDER — METOPROLOL TARTRATE 5 MG/5ML IV SOLN
INTRAVENOUS | Status: AC
  Administered 2017-09-25: 5 mg via INTRAVENOUS
  Filled 2017-09-25: qty 15

## 2017-09-25 MED ORDER — NITROGLYCERIN 0.4 MG SL SUBL
0.8000 mg | SUBLINGUAL_TABLET | Freq: Once | SUBLINGUAL | Status: AC
  Administered 2017-09-25: 0.8 mg via SUBLINGUAL
  Filled 2017-09-25: qty 25

## 2017-09-25 MED ORDER — IOPAMIDOL (ISOVUE-370) INJECTION 76%
INTRAVENOUS | Status: AC
  Administered 2017-09-25: 90 mL
  Filled 2017-09-25: qty 100

## 2017-09-25 MED ORDER — METOPROLOL TARTRATE 5 MG/5ML IV SOLN
5.0000 mg | INTRAVENOUS | Status: DC | PRN
  Administered 2017-09-25 (×2): 5 mg via INTRAVENOUS
  Filled 2017-09-25: qty 5

## 2017-09-25 MED ORDER — NITROGLYCERIN 0.4 MG SL SUBL
SUBLINGUAL_TABLET | SUBLINGUAL | Status: AC
  Administered 2017-09-25: 0.8 mg via SUBLINGUAL
  Filled 2017-09-25: qty 2

## 2017-10-15 ENCOUNTER — Ambulatory Visit: Payer: Commercial Managed Care - PPO | Admitting: Cardiovascular Disease

## 2017-10-20 ENCOUNTER — Ambulatory Visit (HOSPITAL_COMMUNITY): Payer: Commercial Managed Care - PPO

## 2017-10-28 ENCOUNTER — Ambulatory Visit: Payer: Commercial Managed Care - PPO | Admitting: Cardiovascular Disease

## 2017-11-17 ENCOUNTER — Telehealth: Payer: Self-pay | Admitting: Cardiovascular Disease

## 2017-11-17 ENCOUNTER — Ambulatory Visit (HOSPITAL_COMMUNITY)
Admission: RE | Admit: 2017-11-17 | Discharge: 2017-11-17 | Disposition: A | Discharge status: Home / Self Care | Payer: Commercial Managed Care - PPO | Source: Ambulatory Visit | Attending: Cardiology | Admitting: Cardiology

## 2017-11-17 VITALS — BP 104/68 | HR 105 | Wt 184.8 lb

## 2017-11-17 DIAGNOSIS — E282 Polycystic ovarian syndrome: Secondary | ICD-10-CM | POA: Insufficient documentation

## 2017-11-17 DIAGNOSIS — M797 Fibromyalgia: Secondary | ICD-10-CM | POA: Insufficient documentation

## 2017-11-17 DIAGNOSIS — I429 Cardiomyopathy, unspecified: Secondary | ICD-10-CM

## 2017-11-17 DIAGNOSIS — R000 Tachycardia, unspecified: Secondary | ICD-10-CM | POA: Diagnosis not present

## 2017-11-17 DIAGNOSIS — I5022 Chronic systolic (congestive) heart failure: Secondary | ICD-10-CM | POA: Diagnosis not present

## 2017-11-17 DIAGNOSIS — Z79899 Other long term (current) drug therapy: Secondary | ICD-10-CM | POA: Diagnosis not present

## 2017-11-17 DIAGNOSIS — E119 Type 2 diabetes mellitus without complications: Secondary | ICD-10-CM | POA: Insufficient documentation

## 2017-11-17 DIAGNOSIS — R0683 Snoring: Secondary | ICD-10-CM | POA: Diagnosis not present

## 2017-11-17 DIAGNOSIS — I428 Other cardiomyopathies: Secondary | ICD-10-CM

## 2017-11-17 DIAGNOSIS — Z79890 Hormone replacement therapy (postmenopausal): Secondary | ICD-10-CM | POA: Insufficient documentation

## 2017-11-17 DIAGNOSIS — Z7984 Long term (current) use of oral hypoglycemic drugs: Secondary | ICD-10-CM | POA: Diagnosis not present

## 2017-11-17 LAB — BASIC METABOLIC PANEL
ANION GAP: 13 (ref 5–15)
BUN: 11 mg/dL (ref 6–20)
CO2: 22 mmol/L (ref 22–32)
Calcium: 10 mg/dL (ref 8.9–10.3)
Chloride: 105 mmol/L (ref 98–111)
Creatinine, Ser: 0.6 mg/dL (ref 0.44–1.00)
GLUCOSE: 164 mg/dL — AB (ref 70–99)
Potassium: 4.3 mmol/L (ref 3.5–5.1)
SODIUM: 140 mmol/L (ref 135–145)

## 2017-11-17 LAB — IRON AND TIBC
Iron: 51 ug/dL (ref 28–170)
SATURATION RATIOS: 11 % (ref 10.4–31.8)
TIBC: 479 ug/dL — AB (ref 250–450)
UIBC: 428 ug/dL

## 2017-11-17 LAB — FERRITIN: Ferritin: 10 ng/mL — ABNORMAL LOW (ref 11–307)

## 2017-11-17 MED ORDER — BISOPROLOL FUMARATE 5 MG PO TABS: 5.0000 mg | ORAL_TABLET | Freq: Every day | ORAL | 6 refills | Status: DC

## 2017-11-17 MED ORDER — SPIRONOLACTONE 25 MG PO TABS: 12.5000 mg | ORAL_TABLET | Freq: Every day | ORAL | 2 refills | Status: DC

## 2017-11-17 MED ORDER — FUROSEMIDE 40 MG PO TABS: 40.0000 mg | ORAL_TABLET | Freq: Every day | ORAL | 3 refills | Status: DC

## 2017-11-17 NOTE — Telephone Encounter (Signed)
According to the documentation in her chart, the patient would need the echo in August. Can reschedule to August.

## 2017-11-17 NOTE — Progress Notes (Signed)
PCP: Dr. Hal Hope Cardiology: Dr. Allyson Sabal HF Cardiology: Dr. Shirlee Latch  42 y.o. with history of diabetes and fibromyalgia was referred by Dr. Allyson Sabal for evaluation of CHF.  For about 2 years, she has noted an elevated heart rate.  For over a year, she has noted dyspnea walking about 50 yards ("halfway around the grocery store").  She avoids stairs due to dyspnea.  She has orthopnea (gets short of breath lying in bed).  No chest pain.  She recalls an episode about 2 years ago when she developed severe chest pain and dyspnea but did not seek medical care.  She continues to work full time.  She does not drink ETOH or use drugs.  She has never been pregnant.  She has significant daytime sleepiness.   Labs (5/19): K 4.8, creatinine 0.65  ECG (4/19, personally reviewed): sinus tachy with narrow QRS, LVH  PMH: 1. Fibromyalgia 2. PCOS 3. Type II diabetes 4. Chronic systolic CHF: Nonischemic cardiomyopathy.  - Echo (4/19): Severe LV dilation with EF 20-25%, restrictive diastolic function.  - Coronary CTA (5/19): CAC 0, normal coronaries.  5. Sinus tachycardia: Holter (5/19) with average HR 95, rare PVCs, no other arrhythmias.   SH: Rare ETOH, no smoking.  Married, no children or pregnancies.  Works full time in [employer].    FH: No history of CAD or cardiomyopathy.  ROS: All systems reviewed and negative except as per HPI.   Current Outpatient Medications  Medication Sig Dispense Refill  . bisoprolol (ZEBETA) 5 MG tablet Take 1 tablet (5 mg total) by mouth daily. 30 tablet 6  . canagliflozin (INVOKANA) 300 MG TABS tablet Take 300 mg by mouth daily before breakfast.    . clonazePAM (KLONOPIN) 0.5 MG tablet Take 0.5 mg by mouth 2 (two) times daily as needed for anxiety.    . Continuous Blood Gluc Receiver (FREESTYLE LIBRE READER) DEVI 1 Device by Does not apply route as directed. 1 Device 0  . Continuous Blood Gluc Sensor (FREESTYLE LIBRE SENSOR SYSTEM) MISC 1 strip by Does not apply route once a week.  Apply to upper arm and change sensor every 10 days 3 each 3  . furosemide (LASIX) 40 MG tablet Take 1 tablet (40 mg total) by mouth daily. 30 tablet 3  . glimepiride (AMARYL) 1 MG tablet TAKE 1 TABLET BY MOUTH ONCE DAILY WITH BREAKFAST 90 tablet 0  . glucose blood (FREESTYLE INSULINX TEST) test strip Use as directed    . levothyroxine (SYNTHROID, LEVOTHROID) 25 MCG tablet Take 25 mcg by mouth daily before breakfast.    . metFORMIN (GLUCOPHAGE-XR) 500 MG 24 hr tablet TAKE 4 TABLETS BY MOUTH ONCE DAILY WITH SUPPER 360 tablet 0  . norethindrone (MICRONOR,CAMILA,ERRIN) 0.35 MG tablet Take 1 tablet by mouth daily.    . pravastatin (PRAVACHOL) 40 MG tablet TAKE 1 TABLET BY MOUTH ONCE DAILY 30 tablet 3  . sacubitril-valsartan (ENTRESTO) 24-26 MG Take 1 tablet by mouth 2 (two) times daily.    . Semaglutide (OZEMPIC) 0.25 or 0.5 MG/DOSE SOPN Inject into the skin.    Marland Kitchen spironolactone (ALDACTONE) 25 MG tablet Take 0.5 tablets (12.5 mg total) by mouth daily. 15 tablet 2   No current facility-administered medications for this encounter.    BP 104/68   Pulse (!) 105   Wt 184 lb 12.8 oz (83.8 kg)   SpO2 98%   BMI 32.74 kg/m  General: NAD Neck: JVP 11-12 cm, no thyromegaly or thyroid nodule.  Lungs: Clear to auscultation bilaterally with normal  respiratory effort. CV: Nondisplaced PMI.  Heart mildly tachy, regular S1/S2, soft S3, no murmur.  No peripheral edema.  No carotid bruit.  Normal pedal pulses.  Abdomen: Soft, nontender, no hepatosplenomegaly, no distention.  Skin: Intact without lesions or rashes.  Neurologic: Alert and oriented x 3.  Psych: Normal affect. Extremities: No clubbing or cyanosis.  HEENT: Normal.   Assessment/Plan: 1. Chronic systolic CHF: Echo in 5/19 with severe LV dilation, EF 20-25%.  Coronary CT showed no evidence for CAD => nonischemic cardiomyopathy.  No prior pregnancies, so not peri-partum cardiomyopathy.  Mild sinus tachycardia, most recent holter with avg HR 95.  Do  not think this is tachy-mediated CMP.  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  She has NYHA class III symptoms and is volume overloaded with soft S3.  - I will arrange for cardiac MRI to look for infiltrative disease, noncompaction.  - Check Fe studies, ANA, SPEP.  - She is taking Entresto 24/26 bid, continue.  - Increase bisoprolol to 5 mg daily.   - Add spironolactone 12.5 mg daily.  BMET today and in 10 days.   - Increase Lasix to 40 mg daily.  - Discussed low Na diet.  - Narrow QRS, will not be CRT candidate.  Would repeat echo after 6 months of medical treatment (10/19) to determine need for ICD.  - Followup with HF pharmacist in 3 wks for medication titration (increase Entresto).  - She will eventually need RHC.  2. Sinus tachycardia: Holter showed no significant arrhythmias, avg HR 95. I suspect that this is a result of the cardiomyopathy.   3. Suspect OSA: Will arrange for sleep study.   Followup HF pharmacist in 3 wks, me in 6 wks.   Marca Ancona 11/17/2017  -

## 2017-11-17 NOTE — Telephone Encounter (Signed)
New Message   This patient has an order in for a echo.  The patients had her echo scheduled for July 29th, 2019, she had a new patient appt today with Dr Shirlee Latch office, while the patient was at her appointment today the echo was cancelled. Do we need to reach out to the patient and reschedule this or should the order be removed?

## 2017-11-17 NOTE — Patient Instructions (Signed)
Start Spironolactone 12.5 mg (1/2 tab) daily  Increase Bisoprolol 5 mg (1 tab) daily  Increase Furosemide 40 mg (1 tab) daily  Your physician has requested that you have a cardiac MRI. Cardiac MRI uses a computer to create images of your heart as its beating, producing both still and moving pictures of your heart and major blood vessels. For further information please visit [email or web address]. Please follow the instruction sheet given to you today for more information. (they will call you)    Your physician has recommended that you have a sleep study. This test records several body functions during sleep, including: brain activity, eye movement, oxygen and carbon dioxide blood levels, heart rate and rhythm, breathing rate and rhythm, the flow of air through your mouth and nose, snoring, body muscle movements, and chest and belly movement. (they will call you)    Labs drawn today (if we do not call you, then your lab work was stable)   Your physician recommends that you return for lab work in: 10 days  Your physician recommends that you schedule a follow-up appointment in: 3 weeks with Fara Chute D   Your physician recommends that you schedule a follow-up appointment in: 6 weeks with Dr. Shirlee Latch

## 2017-11-18 LAB — PROTEIN ELECTROPHORESIS, SERUM
A/G RATIO SPE: 1.2 (ref 0.7–1.7)
Albumin ELP: 4 g/dL (ref 2.9–4.4)
Alpha-1-Globulin: 0.2 g/dL (ref 0.0–0.4)
Alpha-2-Globulin: 0.9 g/dL (ref 0.4–1.0)
Beta Globulin: 1.3 g/dL (ref 0.7–1.3)
GLOBULIN, TOTAL: 3.4 g/dL (ref 2.2–3.9)
Gamma Globulin: 1.1 g/dL (ref 0.4–1.8)
TOTAL PROTEIN ELP: 7.4 g/dL (ref 6.0–8.5)

## 2017-11-18 LAB — ANA: ANA: NEGATIVE

## 2017-11-23 ENCOUNTER — Encounter (HOSPITAL_COMMUNITY): Payer: Self-pay | Admitting: Cardiology

## 2017-11-27 ENCOUNTER — Ambulatory Visit (HOSPITAL_COMMUNITY)
Admission: RE | Admit: 2017-11-27 | Discharge: 2017-11-27 | Disposition: A | Discharge status: Home / Self Care | Payer: Commercial Managed Care - PPO | Source: Ambulatory Visit | Attending: Cardiology | Admitting: Cardiology

## 2017-11-27 DIAGNOSIS — I429 Cardiomyopathy, unspecified: Secondary | ICD-10-CM | POA: Diagnosis not present

## 2017-11-27 LAB — BASIC METABOLIC PANEL
ANION GAP: 10 (ref 5–15)
BUN: 14 mg/dL (ref 6–20)
CO2: 24 mmol/L (ref 22–32)
Calcium: 9.6 mg/dL (ref 8.9–10.3)
Chloride: 102 mmol/L (ref 98–111)
Creatinine, Ser: 0.63 mg/dL (ref 0.44–1.00)
GFR calc Af Amer: >60 mL/min (ref >60)
GFR calc non Af Amer: >60 mL/min (ref >60)
GLUCOSE: 122 mg/dL — AB (ref 70–99)
POTASSIUM: 3.9 mmol/L (ref 3.5–5.1)
Sodium: 136 mmol/L (ref 135–145)

## 2017-12-03 ENCOUNTER — Encounter (HOSPITAL_COMMUNITY): Payer: Self-pay | Admitting: Cardiology

## 2017-12-08 ENCOUNTER — Encounter (HOSPITAL_COMMUNITY): Payer: Self-pay

## 2017-12-08 ENCOUNTER — Other Ambulatory Visit (HOSPITAL_COMMUNITY): Payer: Commercial Managed Care - PPO

## 2017-12-08 ENCOUNTER — Ambulatory Visit (HOSPITAL_COMMUNITY)
Admission: RE | Admit: 2017-12-08 | Discharge: 2017-12-08 | Disposition: A | Discharge status: Home / Self Care | Payer: Commercial Managed Care - PPO | Source: Ambulatory Visit | Attending: Internal Medicine | Admitting: Internal Medicine

## 2017-12-08 VITALS — BP 108/70 | HR 101 | Wt 178.0 lb

## 2017-12-08 DIAGNOSIS — E119 Type 2 diabetes mellitus without complications: Secondary | ICD-10-CM | POA: Insufficient documentation

## 2017-12-08 DIAGNOSIS — I5022 Chronic systolic (congestive) heart failure: Secondary | ICD-10-CM | POA: Insufficient documentation

## 2017-12-08 DIAGNOSIS — Z5181 Encounter for therapeutic drug level monitoring: Secondary | ICD-10-CM | POA: Diagnosis present

## 2017-12-08 DIAGNOSIS — M797 Fibromyalgia: Secondary | ICD-10-CM | POA: Diagnosis not present

## 2017-12-08 LAB — BASIC METABOLIC PANEL
ANION GAP: 13 (ref 5–15)
BUN: 9 mg/dL (ref 6–20)
CHLORIDE: 102 mmol/L (ref 98–111)
CO2: 22 mmol/L (ref 22–32)
Calcium: 9.9 mg/dL (ref 8.9–10.3)
Creatinine, Ser: 0.68 mg/dL (ref 0.44–1.00)
GFR calc non Af Amer: >60 mL/min (ref >60)
GLUCOSE: 205 mg/dL — AB (ref 70–99)
Potassium: 3.6 mmol/L (ref 3.5–5.1)
Sodium: 137 mmol/L (ref 135–145)

## 2017-12-08 MED ORDER — SPIRONOLACTONE 25 MG PO TABS: 25.0000 mg | ORAL_TABLET | Freq: Every day | ORAL | 2 refills | Status: DC

## 2017-12-08 NOTE — Patient Instructions (Addendum)
Continue low salt diet ( less than 2000mg /day)  Increase spironolactone 25mg   = 1 tab daily Entresto 24-26mg   = 1 whole tablet twice daily   Continue other medicaitions  Follow Up appointment with Pharmacist Cicero Duck) 8/19 at 1:15

## 2017-12-08 NOTE — Progress Notes (Signed)
PCP: Dr. Hal Hope Cardiology: Dr. Allyson Sabal HF Cardiology: Dr. Shirlee Latch  HPI:   42 y.o. with history of diabetes and fibromyalgia was referred by Dr. Allyson Sabal for evaluation of CHF.  For about 2 years, she has noted an elevated heart rate.  For over a year, she has noted dyspnea walking about 50 yards ("halfway around the grocery store").  She avoids stairs due to dyspnea.  She has orthopnea (gets short of breath lying in bed).  No chest pain.  She recalls an episode about 2 years ago when she developed severe chest pain and dyspnea but did not seek medical care.  She continues to work full time.  She does not drink ETOH or use drugs.  She has never been pregnant.  She has significant daytime sleepiness.    Chronic systolic CHF: Echo in 5/19 with severe LV dilation, EF 20-25%.  Coronary CT showed no evidence for CAD => nonischemic cardiomyopathy.  No prior pregnancies, so not peri-partum cardiomyopathy.  Mild sinus tachycardia, most recent holter with avg HR 95.  Do not think this is tachy-mediated CMP.  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  She still feels very fatigued, and usually naps daily.  She works from home 3 days a week and goes into office 2 days a week - this helps her energy level.  Her  SOB has improved.  No SOB at rest and can walk from car to clinic  without SOB.  Her weight continues to drift down with - much healthier diet - less snacking, less salt and making healthier choices when out to eat.   At last visit increased furosemide 40mg  daily, Bisoprolol was increased to 5mg  daily and Spironolactone 12.5mg  daily added. She misunderstood directions and has been  taking Entresto  24/26  - 1/2 tab bid.  She has trouble having a restful sleep - melatonin doesn't; work, so she takes 75mg  diphenhydramine qHS  - last QTC 475 4/19 - may need to discuss alternative.     . Shortness of breath/dyspnea  on exertion? no  . Orthopnea/PND? no . Edema? no . Lightheadedness/dizziness? no . Daily weights at home? yes . Blood pressure/heart rate monitoring at home? yes . Following low-sodium/fluid-restricted diet? yes  HF Medications: Bisoprolol 5mg  Daily Furosemide 40mg  Daily Entresto 1/2 tab on 224-26mg  BID Spironolactone 12.5mg  Daily  Has the patient been experiencing any side effects to the medications prescribed?  no  Does the patient have any problems obtaining medications due to transportation or finances?   no  Understanding of regimen: good Understanding of indications: good Potential of compliance: good Patient understands to avoid NSAIDs. Patient understands to avoid decongestants.    Pertinent Lab Values: 12/08/17 . Serum creatinine 0.68 (stable) , CO2 22, Potassium 3.6 (stable), Sodium 137,    Vital Signs: . Weight: 178lb (dry weight: 176lb at home) . Blood pressure: 108/70 . Heart rate: 101 . Reds 26%  Assessment: 1. Chronicsystolic CHF (EF 91%), due to NICM. NYHA class IIsymptoms. -volume status stable, no abdominal distention, no LE edema, weight stable, ReDS reading 26% - continue furosemide 40mg  daily -BP soft stable 108/70 - usually runs 98-110/70 at home no dizziness or lightheadedness, HR tachy 101 - currently on  BB and already fatigued, will increase Entresto to whole tab 24-26mg  BID and see if improves with afterload reduction -Cr and K stable - increase Spironolactone 25mg  daily -Fe studies drawn - low -  anemia of Chronic disease will set up Feraheme injection -Continue current HF medications Bisoprolol 5mg  Daily Furosemide 40mg  Daily  - Basic disease state pathophysiology, medication indication, mechanism and side effects reviewed at length with patient and he verbalized understanding  Plan: 1) Medication changes: Based on clinical presentation, -volume status stable,- continue furosemide 40mg  daily -BP soft stable 108/70 - HR tachy 101-  increase Entresto to whole tab 24-26mg  BID  -Cr and K stable - increase Spironolactone 25mg  daily -Fe studies drawn - low - set up Feraheme injection -Continue current HF medications Bisoprolol 5mg  Daily Furosemide 40mg  Daily  2) Labs: BMET and CBC at next visit 3) Follow-up: Pharmacist HF clinic 12/29/17 at 1:15   [employer] Pharm.D. CPP, BCPS Clinical Pharmacist [phone] 12/08/2017 10:53 PM

## 2017-12-11 ENCOUNTER — Other Ambulatory Visit (HOSPITAL_COMMUNITY): Payer: Self-pay

## 2017-12-11 MED ORDER — FUROSEMIDE 40 MG PO TABS: 40.0000 mg | ORAL_TABLET | Freq: Every day | ORAL | 3 refills | Status: DC

## 2017-12-16 ENCOUNTER — Telehealth: Payer: Self-pay | Admitting: Cardiology

## 2017-12-16 NOTE — Telephone Encounter (Signed)
Called patient and LVM to call me with the desired time of day for her cardiac MRI.

## 2017-12-19 ENCOUNTER — Telehealth: Payer: Self-pay | Admitting: Cardiology

## 2017-12-19 NOTE — Telephone Encounter (Signed)
Called the patient and LVM to call back with the desired time of day for cardiac MRI to be scheduled.

## 2017-12-22 ENCOUNTER — Other Ambulatory Visit (HOSPITAL_COMMUNITY): Payer: Self-pay | Admitting: Pharmacist

## 2017-12-22 MED ORDER — BISOPROLOL FUMARATE 5 MG PO TABS: 5.0000 mg | ORAL_TABLET | Freq: Every day | ORAL | 3 refills | Status: DC

## 2017-12-29 ENCOUNTER — Ambulatory Visit (HOSPITAL_COMMUNITY)
Admission: RE | Admit: 2017-12-29 | Discharge: 2017-12-29 | Disposition: A | Discharge status: Home / Self Care | Payer: Commercial Managed Care - PPO | Source: Ambulatory Visit | Attending: Internal Medicine | Admitting: Internal Medicine

## 2017-12-29 VITALS — HR 114 | Wt 178.6 lb

## 2017-12-29 DIAGNOSIS — Z79899 Other long term (current) drug therapy: Secondary | ICD-10-CM | POA: Diagnosis not present

## 2017-12-29 DIAGNOSIS — I5022 Chronic systolic (congestive) heart failure: Secondary | ICD-10-CM | POA: Diagnosis present

## 2017-12-29 DIAGNOSIS — I428 Other cardiomyopathies: Secondary | ICD-10-CM | POA: Insufficient documentation

## 2017-12-29 DIAGNOSIS — D509 Iron deficiency anemia, unspecified: Secondary | ICD-10-CM

## 2017-12-29 DIAGNOSIS — I519 Heart disease, unspecified: Secondary | ICD-10-CM

## 2017-12-29 LAB — CBC
HEMATOCRIT: 42.2 % (ref 36.0–46.0)
HEMOGLOBIN: 14 g/dL (ref 12.0–15.0)
MCH: 28.1 pg (ref 26.0–34.0)
MCHC: 33.2 g/dL (ref 30.0–36.0)
MCV: 84.6 fL (ref 78.0–100.0)
Platelets: 442 10*3/uL — ABNORMAL HIGH (ref 150–400)
RBC: 4.99 MIL/uL (ref 3.87–5.11)
RDW: 13.2 % (ref 11.5–15.5)
WBC: 6.2 10*3/uL (ref 4.0–10.5)

## 2017-12-29 LAB — BASIC METABOLIC PANEL
ANION GAP: 13 (ref 5–15)
BUN: 12 mg/dL (ref 6–20)
CHLORIDE: 103 mmol/L (ref 98–111)
CO2: 21 mmol/L — ABNORMAL LOW (ref 22–32)
Calcium: 9.5 mg/dL (ref 8.9–10.3)
Creatinine, Ser: 0.77 mg/dL (ref 0.44–1.00)
Glucose, Bld: 216 mg/dL — ABNORMAL HIGH (ref 70–99)
Potassium: 3.8 mmol/L (ref 3.5–5.1)
SODIUM: 137 mmol/L (ref 135–145)

## 2017-12-29 MED ORDER — SODIUM CHLORIDE 0.9 % IV SOLN
510.0000 mg | INTRAVENOUS | Status: DC
  Filled 2017-12-29: qty 17

## 2017-12-29 MED ORDER — BISOPROLOL FUMARATE 5 MG PO TABS: 7.5000 mg | ORAL_TABLET | Freq: Every day | ORAL | 5 refills | Status: DC

## 2017-12-29 NOTE — Progress Notes (Signed)
HF MD: [facility]  HPI:  Ms. Lydia Taylor is a 42 y.o.F with history of diabetes and fibromyalgia was referred by Dr. Allyson Sabal for evaluation of CHF. For about 2 years, she has noted an elevated heart rate. For over a year, she has noted dyspnea walking about 50 yards ("halfway around the grocery store"). She avoids stairs due to dyspnea. She has orthopnea (gets short of breath lying in bed). No chest pain. She recalls an episode about 2 years ago when she developed severe chest pain and dyspnea but did not seek medical care. She continues to work full time. She does not drink ETOH or use drugs. She has never been pregnant. She has significant daytime sleepiness.   Chronic systolic CHF: Echo in 5/19 with severe LV dilation, EF 20-25%. Coronary CT showed no evidence for CAD =>nonischemic cardiomyopathy. No prior pregnancies, so not peri-partum cardiomyopathy. Mild sinus tachycardia, most recent holter with avg HR 95. Do not think this is tachy-mediated CMP. 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  At last HF pharmacy visit on 7/29, her Sherryll Burger was increased to 24-26 mg BID (was mistakenly taking 1/2 tab BID) and spironolactone was increased to 25 mg daily. She still feels very fatigued and is having headaches occassionally. Her  SOB has improved although it worsens in the heat.  She was supposed to be set up for a feraheme infusion but this was never completed. Will schedule her today. She also has not been set up for cardiac MRI yet because she missed the scheduling call. Will call again today.     . Shortness of breath/dyspnea on exertion? No - heat related SOB  . Orthopnea/PND? no . Edema? no . Lightheadedness/dizziness? Yes - rarely  . Daily weights at home? Yes - stable  . Blood pressure/heart rate monitoring at home? Yes - HR 90-100s . Following low-sodium/fluid-restricted diet? yes  HF  Medications: Bisoprolol 5 mg PO daily Furosemide 40 mg PO daily Entresto 24-26 mg PO BID Spironolactone 25 mg PO daily   Has the patient been experiencing any side effects to the medications prescribed?  no  Does the patient have any problems obtaining medications due to transportation or finances?   No - [employer] commercial  Understanding of regimen: good Understanding of indications: good Potential of compliance: good Patient understands to avoid NSAIDs. Patient understands to avoid decongestants.    Pertinent Lab Values: . 12/29/17: Serum creatinine 0.77, BUN 12, Potassium 3.8, Sodium 137  Vital Signs: . Weight: 178.6 lb (last HF clinic visit weight: 178 lb) . Blood pressure: 102/76 mmHg (104-112) . Heart rate: 114 bpm (101-107) . CLIP: 34% today, 26% at last visit  Assessment: 1. Chronicsystolic CHF (EF 60%), due to NICM. NYHA class IIsymptoms.  - Volume status stable although CLIP reading trend up from last visit   - Increase bisoprolol to 7.5 mg daily   - Continue furosemide 40 mg daily, Entresto 24-26 mg BID and spironolactone 25 mg daily   - Patient to call to schedule cardiac MRI - Basic disease state pathophysiology, medication indication, mechanism and side effects reviewed at length with patient and he verbalized understanding 2. Iron deficiency  - Iron panel on 11/17/17 - ferritin low at 10, tsat low at 11% - IV Feraheme set up x 2 starting next Monday 8/26 - CBC wnl on check today  3. Sinus tachycardia: Holter showed no significant arrhythmias, avg HR 95. Suspect that this  is a result of the cardiomyopathy.   4. Suspect OSA:  - Sleep study scheduled for next month  Plan: 1) Medication changes: Based on clinical presentation, vital signs and recent labs will increase bisoprolol to 7.5 mg daily 2) Labs: BMET and CBC today 3) Follow-up: Dr. Shirlee Latch on 01/19/18   Tyler Deis. Bonnye Fava, PharmD, BCPS, CPP Clinical Pharmacist Phone: [phone] 12/29/2017 10:24  AM

## 2017-12-29 NOTE — Patient Instructions (Signed)
It was great to meet you!  Please INCREASE bisoprolol to 7.5 mg daily.   Blood work today. We will call you with any changes.   Please keep your appointment with Dr. Shirlee Latch on 01/19/18.

## 2017-12-30 ENCOUNTER — Encounter: Payer: Self-pay | Admitting: Cardiology

## 2017-12-30 ENCOUNTER — Other Ambulatory Visit (HOSPITAL_COMMUNITY): Payer: Self-pay | Admitting: Pharmacist

## 2017-12-30 MED ORDER — SACUBITRIL-VALSARTAN 24-26 MG PO TABS: 1.0000 | ORAL_TABLET | Freq: Two times a day (BID) | ORAL | 5 refills | Status: DC

## 2018-01-02 ENCOUNTER — Other Ambulatory Visit (HOSPITAL_COMMUNITY): Payer: Self-pay | Admitting: Pharmacist

## 2018-01-02 MED ORDER — SPIRONOLACTONE 25 MG PO TABS: 25.0000 mg | ORAL_TABLET | Freq: Every day | ORAL | 3 refills | Status: DC

## 2018-01-05 ENCOUNTER — Ambulatory Visit (HOSPITAL_COMMUNITY)
Admission: RE | Admit: 2018-01-05 | Discharge: 2018-01-05 | Disposition: A | Discharge status: Home / Self Care | Payer: Commercial Managed Care - PPO | Source: Ambulatory Visit | Attending: Cardiology | Admitting: Cardiology

## 2018-01-05 DIAGNOSIS — D509 Iron deficiency anemia, unspecified: Secondary | ICD-10-CM | POA: Diagnosis not present

## 2018-01-05 MED ORDER — SODIUM CHLORIDE 0.9 % IV SOLN
510.0000 mg | INTRAVENOUS | Status: DC
  Administered 2018-01-05: 510 mg via INTRAVENOUS
  Filled 2018-01-05: qty 17

## 2018-01-05 NOTE — Discharge Instructions (Signed)

## 2018-01-07 ENCOUNTER — Ambulatory Visit (HOSPITAL_COMMUNITY)
Admission: RE | Admit: 2018-01-07 | Discharge: 2018-01-07 | Disposition: A | Discharge status: Home / Self Care | Payer: Commercial Managed Care - PPO | Source: Ambulatory Visit | Attending: Cardiology | Admitting: Cardiology

## 2018-01-07 DIAGNOSIS — I5022 Chronic systolic (congestive) heart failure: Secondary | ICD-10-CM

## 2018-01-07 DIAGNOSIS — I517 Cardiomegaly: Secondary | ICD-10-CM | POA: Insufficient documentation

## 2018-01-07 DIAGNOSIS — I429 Cardiomyopathy, unspecified: Secondary | ICD-10-CM | POA: Diagnosis not present

## 2018-01-07 MED ORDER — GADOBENATE DIMEGLUMINE 529 MG/ML IV SOLN
30.0000 mL | Freq: Once | INTRAVENOUS | Status: AC
  Administered 2018-01-07: 26 mL via INTRAVENOUS

## 2018-01-09 ENCOUNTER — Other Ambulatory Visit (HOSPITAL_COMMUNITY): Payer: Self-pay | Admitting: *Deleted

## 2018-01-13 ENCOUNTER — Other Ambulatory Visit (HOSPITAL_COMMUNITY): Payer: Self-pay | Admitting: Cardiology

## 2018-01-13 ENCOUNTER — Encounter (HOSPITAL_COMMUNITY)
Admission: RE | Admit: 2018-01-13 | Discharge: 2018-01-13 | Disposition: A | Discharge status: Home / Self Care | Payer: Commercial Managed Care - PPO | Source: Ambulatory Visit | Attending: Cardiology | Admitting: Cardiology

## 2018-01-13 DIAGNOSIS — D509 Iron deficiency anemia, unspecified: Secondary | ICD-10-CM

## 2018-01-13 MED ORDER — SODIUM CHLORIDE 0.9 % IV SOLN
510.0000 mg | INTRAVENOUS | Status: DC
  Administered 2018-01-13: 510 mg via INTRAVENOUS
  Filled 2018-01-13: qty 17

## 2018-01-16 ENCOUNTER — Encounter (HOSPITAL_BASED_OUTPATIENT_CLINIC_OR_DEPARTMENT_OTHER): Payer: Commercial Managed Care - PPO

## 2018-01-19 ENCOUNTER — Encounter (HOSPITAL_COMMUNITY): Payer: Self-pay | Admitting: Cardiology

## 2018-01-19 ENCOUNTER — Ambulatory Visit (HOSPITAL_COMMUNITY)
Admission: RE | Admit: 2018-01-19 | Discharge: 2018-01-19 | Disposition: A | Discharge status: Home / Self Care | Payer: Commercial Managed Care - PPO | Source: Ambulatory Visit | Attending: Cardiology | Admitting: Cardiology

## 2018-01-19 ENCOUNTER — Other Ambulatory Visit: Payer: Self-pay

## 2018-01-19 VITALS — BP 121/88 | HR 88 | Wt 180.1 lb

## 2018-01-19 DIAGNOSIS — I5022 Chronic systolic (congestive) heart failure: Secondary | ICD-10-CM | POA: Diagnosis not present

## 2018-01-19 DIAGNOSIS — Z79890 Hormone replacement therapy (postmenopausal): Secondary | ICD-10-CM | POA: Insufficient documentation

## 2018-01-19 DIAGNOSIS — Z7984 Long term (current) use of oral hypoglycemic drugs: Secondary | ICD-10-CM | POA: Diagnosis not present

## 2018-01-19 DIAGNOSIS — E119 Type 2 diabetes mellitus without complications: Secondary | ICD-10-CM | POA: Diagnosis not present

## 2018-01-19 DIAGNOSIS — R000 Tachycardia, unspecified: Secondary | ICD-10-CM | POA: Diagnosis not present

## 2018-01-19 DIAGNOSIS — Z79899 Other long term (current) drug therapy: Secondary | ICD-10-CM | POA: Insufficient documentation

## 2018-01-19 DIAGNOSIS — M797 Fibromyalgia: Secondary | ICD-10-CM | POA: Insufficient documentation

## 2018-01-19 LAB — BASIC METABOLIC PANEL
ANION GAP: 12 (ref 5–15)
BUN: 10 mg/dL (ref 6–20)
CO2: 22 mmol/L (ref 22–32)
Calcium: 9.5 mg/dL (ref 8.9–10.3)
Chloride: 108 mmol/L (ref 98–111)
Creatinine, Ser: 0.6 mg/dL (ref 0.44–1.00)
GFR calc non Af Amer: >60 mL/min (ref >60)
GLUCOSE: 142 mg/dL — AB (ref 70–99)
POTASSIUM: 4.2 mmol/L (ref 3.5–5.1)
Sodium: 142 mmol/L (ref 135–145)

## 2018-01-19 MED ORDER — SACUBITRIL-VALSARTAN 49-51 MG PO TABS: 1.0000 | ORAL_TABLET | Freq: Two times a day (BID) | ORAL | 3 refills | Status: DC

## 2018-01-19 MED ORDER — FUROSEMIDE 40 MG PO TABS: ORAL_TABLET | ORAL | 1 refills | Status: DC

## 2018-01-19 NOTE — Patient Instructions (Signed)
Increase Entresto to 49/51 mg Twice daily   Decrease Furosemide (Lasix) to 40 mg daily every other day ALTERNATING with 20 mg (1/2 tab) daily every other day   Lab today  Lab in 10 days  Please follow up with Cicero Duck, our heart failure pharmacist in 3 weeks  Your physician has requested that you have an echocardiogram. Echocardiography is a painless test that uses sound waves to create images of your heart. It provides your doctor with information about the size and shape of your heart and how well your heart's chambers and valves are working. This procedure takes approximately one hour. There are no restrictions for this procedure.  [place]  Your physician has recommended that you have a cardiopulmonary stress test (CPX). CPX testing is a non-invasive measurement of heart and lung function. It replaces a traditional treadmill stress test. This type of test provides a tremendous amount of information that relates not only to your present condition but also for future outcomes. This test combines measurements of you ventilation, respiratory gas exchange in the lungs, electrocardiogram (EKG), blood pressure and physical response before, during, and following an exercise protocol.  Your physician recommends that you schedule a follow-up appointment in: October

## 2018-01-19 NOTE — Progress Notes (Signed)
PCP: Dr. Hal Hope Cardiology: Dr. Allyson Sabal HF Cardiology: Dr. Shirlee Latch  42 y.o. with history of diabetes and fibromyalgia was referred by Dr. Allyson Sabal for evaluation of CHF.  For about 2 years, she has noted an elevated heart rate.  For over a year, she had noted dyspnea walking about 50 yards ("halfway around the grocery store").  She avoids stairs due to dyspnea.  She recalls an episode about 2 years ago when she developed severe chest pain and dyspnea but did not seek medical care.  She continues to work full time.  She does not drink ETOH or use drugs.  She has never been pregnant.  She has significant daytime sleepiness.   Cardiac MRI was done in 8/19 showing EF 20% with preserved RV function, no LGE.    She returns for followup of CHF. Weight is down 4 lbs. No lightheadedness with medication titration.  No dyspnea walking on flat ground.  Occasional nonexertional chest soreness. She does get short of breath if she tries to walk fast.  She has been walking for exercise. No orthopnea/PND.   Labs (5/19): K 4.8, creatinine 0.65 Labs (7/19): ANA negative, transferrin saturation 11%, SPEP negative.  Labs (8/19): K 3.8, creatinine 0.77  ECG (4/19, personally reviewed): sinus tachy with narrow QRS, LVH  PMH: 1. Fibromyalgia 2. PCOS 3. Type II diabetes 4. Chronic systolic CHF: Nonischemic cardiomyopathy.  - Echo (4/19): Severe LV dilation with EF 20-25%, restrictive diastolic function.  - Coronary CTA (5/19): CAC 0, normal coronaries.  - Cardiac MRI (8/19): moderate LV dilation with EF 20%, normal RV size and systolic function, EF 60%.  No LGE.  5. Sinus tachycardia: Holter (5/19) with average HR 95, rare PVCs, no other arrhythmias.   SH: Rare ETOH, no smoking.  Married, no children or pregnancies.  Works full time in [employer].    FH: No history of CAD or cardiomyopathy.  ROS: All systems reviewed and negative except as per HPI.   Current Outpatient Medications  Medication Sig Dispense Refill  .  bisoprolol (ZEBETA) 5 MG tablet Take 1.5 tablets (7.5 mg total) by mouth daily. 45 tablet 5  . canagliflozin (INVOKANA) 300 MG TABS tablet Take 300 mg by mouth daily before breakfast.    . cholecalciferol (VITAMIN D) 1000 units tablet Take 1,000 Units by mouth daily.    . clonazePAM (KLONOPIN) 0.5 MG tablet Take 0.5 mg by mouth 2 (two) times daily as needed for anxiety.    . Continuous Blood Gluc Receiver (FREESTYLE LIBRE READER) DEVI 1 Device by Does not apply route as directed. 1 Device 0  . Continuous Blood Gluc Sensor (FREESTYLE LIBRE SENSOR SYSTEM) MISC 1 strip by Does not apply route once a week. Apply to upper arm and change sensor every 10 days 3 each 3  . furosemide (LASIX) 40 MG tablet Take 1 tab daily every other day ALTERNATING with 1/2 tab daily every other day 90 tablet 1  . glimepiride (AMARYL) 1 MG tablet TAKE 1 TABLET BY MOUTH ONCE DAILY WITH BREAKFAST 90 tablet 0  . glucose blood (FREESTYLE INSULINX TEST) test strip Use as directed    . lamoTRIgine (LAMICTAL) 25 MG tablet Take 25 mg by mouth 2 (two) times daily.  1  . levothyroxine (SYNTHROID, LEVOTHROID) 25 MCG tablet Take 25 mcg by mouth daily before breakfast.    . metFORMIN (GLUCOPHAGE) 500 MG tablet Take 500 mg by mouth 2 (two) times daily with a meal.    . norethindrone (MICRONOR,CAMILA,ERRIN) 0.35 MG tablet Take  1 tablet by mouth daily.    . pravastatin (PRAVACHOL) 40 MG tablet TAKE 1 TABLET BY MOUTH ONCE DAILY 30 tablet 3  . Semaglutide (OZEMPIC) 0.25 or 0.5 MG/DOSE SOPN Inject 0.5 mg into the skin once a week.     . spironolactone (ALDACTONE) 25 MG tablet Take 1 tablet (25 mg total) by mouth daily. 90 tablet 3  . vitamin B-12 (CYANOCOBALAMIN) 500 MCG tablet Take 500 mcg by mouth daily.    . sacubitril-valsartan (ENTRESTO) 49-51 MG Take 1 tablet by mouth 2 (two) times daily. 60 tablet 3   No current facility-administered medications for this encounter.    BP 121/88   Pulse 88   Wt 81.7 kg (180 lb 2 oz)   SpO2 100%    BMI 31.91 kg/m  General: NAD Neck: JVP 7-8 cm, no thyromegaly or thyroid nodule.  Lungs: Clear to auscultation bilaterally with normal respiratory effort. CV: Nondisplaced PMI.  Heart regular S1/S2, no S3/S4, no murmur.  No peripheral edema.  No carotid bruit.  Normal pedal pulses.  Abdomen: Soft, nontender, no hepatosplenomegaly, no distention.  Skin: Intact without lesions or rashes.  Neurologic: Alert and oriented x 3.  Psych: Normal affect. Extremities: No clubbing or cyanosis.  HEENT: Normal.   Assessment/Plan: 1. Chronic systolic CHF: Echo in 5/19 with severe LV dilation, EF 20-25%.  Coronary CT showed no evidence for CAD => nonischemic cardiomyopathy.  No prior pregnancies, so not peri-partum cardiomyopathy.  Mild sinus tachycardia, most recent holter with avg HR 95.  Do not think this is tachy-mediated CMP.  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  Cardiac MRI in 8/19 showed EF 20%, normal RV, no myocardial LGE.  She has NYHA class II symptoms now and seems to be doing better.  She is not volume overloaded on exam. I think it is also a good sign that heart rate is lower today.  - Increase Entresto to 49/51 bid.  BMET today and in 10 days.   - Continue bisoprolol 7.5 mg daily.   - Continue spironolactone 25 mg daily.   - With increase in Entresto, can decrease Lasix to 40 daily alternating with 20 mg daily.   - Narrow QRS, will not be CRT candidate.  Will repeat echo after 6 months of medical treatment (10/19) to determine need for ICD.  - I will arrange for CPX.  - Followup with HF pharmacist in 3 wks for medication titration.  2. Sinus tachycardia: Resolved with medical treatment of cardiomyopathy.  3. Suspect OSA: Sleep study will be in October.   Followup HF pharmacist in 3 wks, me in 10/19 with echo  Lydia Taylor 01/19/2018  -

## 2018-01-29 ENCOUNTER — Ambulatory Visit (HOSPITAL_COMMUNITY)
Admission: RE | Admit: 2018-01-29 | Discharge: 2018-01-29 | Disposition: A | Discharge status: Home / Self Care | Payer: Commercial Managed Care - PPO | Source: Ambulatory Visit | Attending: Internal Medicine | Admitting: Internal Medicine

## 2018-01-29 DIAGNOSIS — I5022 Chronic systolic (congestive) heart failure: Secondary | ICD-10-CM | POA: Diagnosis present

## 2018-01-29 LAB — BASIC METABOLIC PANEL
ANION GAP: 12 (ref 5–15)
BUN: 8 mg/dL (ref 6–20)
CHLORIDE: 103 mmol/L (ref 98–111)
CO2: 22 mmol/L (ref 22–32)
CREATININE: 0.57 mg/dL (ref 0.44–1.00)
Calcium: 9.5 mg/dL (ref 8.9–10.3)
GFR calc non Af Amer: >60 mL/min (ref >60)
GLUCOSE: 146 mg/dL — AB (ref 70–99)
Potassium: 4.2 mmol/L (ref 3.5–5.1)
Sodium: 137 mmol/L (ref 135–145)

## 2018-02-04 ENCOUNTER — Encounter (HOSPITAL_COMMUNITY): Payer: Commercial Managed Care - PPO

## 2018-02-09 ENCOUNTER — Other Ambulatory Visit (HOSPITAL_COMMUNITY): Payer: Self-pay | Admitting: *Deleted

## 2018-02-09 ENCOUNTER — Ambulatory Visit (HOSPITAL_COMMUNITY): Payer: Commercial Managed Care - PPO | Attending: Internal Medicine

## 2018-02-09 DIAGNOSIS — I5022 Chronic systolic (congestive) heart failure: Secondary | ICD-10-CM

## 2018-02-09 NOTE — Progress Notes (Signed)
HF MD: [facility]  HPI:  Lydia Taylor is a 42 y.o.F with history of diabetes and fibromyalgia was referred by Dr. Allyson Sabal for evaluation of CHF. For about 2 years, she has noted an elevated heart rate. For over a year, she has noted dyspnea walking about 50 yards ("halfway around the grocery store"). She avoids stairs due to dyspnea. She has orthopnea (gets short of breath lying in bed). No chest pain. She recalls an episode about 2 years ago when she developed severe chest pain and dyspnea but did not seek medical care. She continues to work full time. She does not drink ETOH or use drugs. She has never been pregnant. She has significant daytime sleepiness.   Chronic systolic CHF: Echo in 5/19 with severe LV dilation, EF 20-25%. Coronary CT showed no evidence for CAD =>nonischemic cardiomyopathy. No prior pregnancies, so not peri-partum cardiomyopathy. Mild sinus tachycardia, most recent holter with avg HR 95. Do not think this is tachy-mediated CMP. 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 At last HF clinic visit on 9/9, her Sherryll Burger was increased to 49-51 mg BID and furosemide was decreased to 40 mg QOD alternating with 20 mg QOD. Her SOB has improved but she is having some hand swelling since furosemide dose was decreased. She did not take her morning doses of medications today but she reports good compliance with them usually.    Shortness of breath/dyspnea on exertion? No   Orthopnea/PND? no  Edema? no  Lightheadedness/dizziness? No   Daily weights at home? Yes - stable   Blood pressure/heart rate monitoring at home? No - has not been logging her HR since 9/5 but will start again  Following low-sodium/fluid-restricted diet? yes  HF Medications: Bisoprolol 7.5 mg PO daily Furosemide 40 mg PO QOD alternating with 20 mg PO QOD Entresto 49-51 mg PO BID Spironolactone 25 mg PO  daily   Has the patient been experiencing any side effects to the medications prescribed?  no  Does the patient have any problems obtaining medications due to transportation or finances?   No - [employer] commercial  Understanding of regimen: good Understanding of indications: good Potential of compliance: good Patient understands to avoid NSAIDs. Patient understands to avoid decongestants.   Pertinent Lab Values:  01/29/18: Serum creatinine 0.57, BUN 8, Potassium 4.2, Sodium 137  02/10/18: Iron panel: ferritin 229, tsat 21%; CBC: Hgb 14  Vital Signs:  Weight: 181.6 lb (last HF clinic visit weight: 178 lb)  Blood pressure: 98/68 mmHg (104-112)  Heart rate: 95 bpm (101-107)  CLIP: 27% today, 34% last pharmacy visit  Assessment: 1. Chronicsystolic CHF (EF 38%), due to NICM. NYHA class IIsymptoms.  - Volume status stable although having some hand swelling recently  - Advised patient to take 40 mg furosemide on 20 mg days PRN when having hand swelling or when weight increases >3 lb in 1 day or >5 lb in 1 week   - With low BP (even without meds this am), no room to uptitrate any HF meds so will continue bisoprolol 7.5 mg daily, furosemide 40 mg QOD alternating with 20 mg QOD, Entresto 49-51 mg BID and spironolactone 25 mg daily   - Have advised patient to start logging her HR at home again - Basic disease state pathophysiology, medication indication, mechanism and side effects reviewed at length with patient and she verbalized understanding  2. Iron deficiency  - Iron panel on 11/17/17 - ferritin  low at 10, tsat low at 11%, improved today to ferritin 229, tsat 21% - Received IV Feraheme x 2 starting 8/26  3. Sinus tachycardia:  - Resolved with medical treatment of cardiomyopathy.   4. Suspect OSA:  - Sleep study scheduled for this month  Plan: 1) Medication changes: Based on clinical presentation, vital signs and recent labs will continue current regimen but advised to take  furosemide 40 mg PRN on 20 mg days if hand swelling or weight gain 2) Labs: Iron panel and CBC today 3) Follow-up: Dr. Shirlee Latch with ECHO on 03/16/18   Tyler Deis. Bonnye Fava, PharmD, BCPS, CPP Clinical Pharmacist Phone: [phone]

## 2018-02-10 ENCOUNTER — Ambulatory Visit (HOSPITAL_COMMUNITY)
Admission: RE | Admit: 2018-02-10 | Discharge: 2018-02-10 | Disposition: A | Discharge status: Home / Self Care | Payer: Commercial Managed Care - PPO | Source: Ambulatory Visit | Attending: Cardiology | Admitting: Cardiology

## 2018-02-10 VITALS — BP 98/68 | HR 95 | Wt 181.6 lb

## 2018-02-10 DIAGNOSIS — I5022 Chronic systolic (congestive) heart failure: Secondary | ICD-10-CM | POA: Diagnosis not present

## 2018-02-10 DIAGNOSIS — M797 Fibromyalgia: Secondary | ICD-10-CM | POA: Diagnosis not present

## 2018-02-10 DIAGNOSIS — D509 Iron deficiency anemia, unspecified: Secondary | ICD-10-CM | POA: Diagnosis present

## 2018-02-10 DIAGNOSIS — R000 Tachycardia, unspecified: Secondary | ICD-10-CM | POA: Diagnosis not present

## 2018-02-10 DIAGNOSIS — E119 Type 2 diabetes mellitus without complications: Secondary | ICD-10-CM | POA: Insufficient documentation

## 2018-02-10 LAB — CBC
HCT: 41.6 % (ref 36.0–46.0)
Hemoglobin: 14 g/dL (ref 12.0–15.0)
MCH: 29.5 pg (ref 26.0–34.0)
MCHC: 33.7 g/dL (ref 30.0–36.0)
MCV: 87.8 fL (ref 78.0–100.0)
PLATELETS: 355 10*3/uL (ref 150–400)
RBC: 4.74 MIL/uL (ref 3.87–5.11)
RDW: 14.2 % (ref 11.5–15.5)
WBC: 7 10*3/uL (ref 4.0–10.5)

## 2018-02-10 LAB — IRON AND TIBC
Iron: 74 ug/dL (ref 28–170)
Saturation Ratios: 21 % (ref 10.4–31.8)
TIBC: 353 ug/dL (ref 250–450)
UIBC: 279 ug/dL

## 2018-02-10 LAB — FERRITIN: Ferritin: 229 ng/mL (ref 11–307)

## 2018-02-10 NOTE — Patient Instructions (Addendum)
It was great to see you today!  Please continue your current medications but take 1 full tablet (40 mg) of furosemide on your 1/2 tablet days when you have swelling in your hands or feet or when your weight increases >3 lb in 1 day or >5 lb in 1 week.   Blood work today. We will call you with any changes.   Please keep your appointment with Dr. Shirlee Latch with an echocardiogram on 03/16/18.

## 2018-02-13 ENCOUNTER — Ambulatory Visit (HOSPITAL_BASED_OUTPATIENT_CLINIC_OR_DEPARTMENT_OTHER): Payer: Commercial Managed Care - PPO | Attending: Cardiology

## 2018-02-19 ENCOUNTER — Telehealth (HOSPITAL_COMMUNITY): Payer: Self-pay

## 2018-02-19 ENCOUNTER — Other Ambulatory Visit (HOSPITAL_COMMUNITY): Payer: Self-pay | Admitting: Pharmacist

## 2018-02-19 MED ORDER — BISOPROLOL FUMARATE 5 MG PO TABS: 7.5000 mg | ORAL_TABLET | Freq: Every day | ORAL | 5 refills | Status: DC

## 2018-02-19 NOTE — Telephone Encounter (Signed)
Pt called about results from stress test no answer VM left for pt to call back Submaximal but probably moderate HF limitation 

## 2018-03-11 ENCOUNTER — Telehealth (HOSPITAL_COMMUNITY): Payer: Self-pay

## 2018-03-11 NOTE — Telephone Encounter (Signed)
Pt called about results from stress test no answer VM left for pt to call back Submaximal but probably moderate HF limitation

## 2018-03-13 ENCOUNTER — Telehealth (HOSPITAL_COMMUNITY): Payer: Self-pay

## 2018-03-13 NOTE — Telephone Encounter (Signed)
Pt called back given results from stress test pt has an appointment 03/16/18 with an Echo

## 2018-03-16 ENCOUNTER — Ambulatory Visit (HOSPITAL_BASED_OUTPATIENT_CLINIC_OR_DEPARTMENT_OTHER)
Admission: RE | Admit: 2018-03-16 | Discharge: 2018-03-16 | Disposition: A | Discharge status: Home / Self Care | Payer: Commercial Managed Care - PPO | Source: Ambulatory Visit | Attending: Cardiology | Admitting: Cardiology

## 2018-03-16 ENCOUNTER — Encounter (HOSPITAL_COMMUNITY): Payer: Self-pay | Admitting: Cardiology

## 2018-03-16 ENCOUNTER — Ambulatory Visit (HOSPITAL_COMMUNITY)
Admission: RE | Admit: 2018-03-16 | Discharge: 2018-03-16 | Disposition: A | Discharge status: Home / Self Care | Payer: Commercial Managed Care - PPO | Source: Ambulatory Visit | Attending: Cardiology | Admitting: Cardiology

## 2018-03-16 VITALS — BP 98/62 | HR 96 | Wt 183.0 lb

## 2018-03-16 DIAGNOSIS — I428 Other cardiomyopathies: Secondary | ICD-10-CM | POA: Diagnosis not present

## 2018-03-16 DIAGNOSIS — E119 Type 2 diabetes mellitus without complications: Secondary | ICD-10-CM | POA: Diagnosis not present

## 2018-03-16 DIAGNOSIS — M797 Fibromyalgia: Secondary | ICD-10-CM | POA: Diagnosis not present

## 2018-03-16 DIAGNOSIS — Z7984 Long term (current) use of oral hypoglycemic drugs: Secondary | ICD-10-CM | POA: Diagnosis not present

## 2018-03-16 DIAGNOSIS — Z79890 Hormone replacement therapy (postmenopausal): Secondary | ICD-10-CM | POA: Diagnosis not present

## 2018-03-16 DIAGNOSIS — I5022 Chronic systolic (congestive) heart failure: Secondary | ICD-10-CM | POA: Insufficient documentation

## 2018-03-16 DIAGNOSIS — Z79899 Other long term (current) drug therapy: Secondary | ICD-10-CM | POA: Diagnosis not present

## 2018-03-16 LAB — BASIC METABOLIC PANEL
Anion gap: 9 (ref 5–15)
BUN: 12 mg/dL (ref 6–20)
CHLORIDE: 108 mmol/L (ref 98–111)
CO2: 19 mmol/L — AB (ref 22–32)
Calcium: 9.6 mg/dL (ref 8.9–10.3)
Creatinine, Ser: 0.57 mg/dL (ref 0.44–1.00)
GFR calc Af Amer: >60 mL/min (ref >60)
GFR calc non Af Amer: >60 mL/min (ref >60)
GLUCOSE: 148 mg/dL — AB (ref 70–99)
POTASSIUM: 4 mmol/L (ref 3.5–5.1)
Sodium: 136 mmol/L (ref 135–145)

## 2018-03-16 MED ORDER — BISOPROLOL FUMARATE 10 MG PO TABS: 10.0000 mg | ORAL_TABLET | Freq: Every day | ORAL | 3 refills | Status: DC

## 2018-03-16 NOTE — Progress Notes (Signed)
  Echocardiogram 2D Echocardiogram has been performed.  Lydia Taylor 03/16/2018, 12:09 PM

## 2018-03-16 NOTE — Progress Notes (Signed)
PCP: Dr. Hal Hope Cardiology: Dr. Allyson Sabal HF Cardiology: Dr. Shirlee Latch  42 y.o. with history of diabetes and fibromyalgia was referred by Dr. Allyson Sabal for evaluation of CHF.  For about 2 years, she has noted an elevated heart rate.  For over a year, she had noted dyspnea walking about 50 yards ("halfway around the grocery store").  She avoids stairs due to dyspnea.  She recalls an episode about 2 years ago when she developed severe chest pain and dyspnea but did not seek medical care.  She continues to work full time.  She does not drink ETOH or use drugs.  She has never been pregnant.  She has significant daytime sleepiness.   Cardiac MRI was done in 8/19 showing EF 20% with preserved RV function, no LGE.    Repeat echo was done today and reviewed.  EF 30% with normal RV size and systolic function.   She returns for followup of CHF. Weight is up 3 lbs. She fatigues when walking long distances or walking up stairs.  Generally no problems with ADLs and work around the house.  Mild fatigue with taking a shower, fatigue with vacuuming.  No lightheadedness.  No orthopnea/PND.  No chest pain.   Labs (5/19): K 4.8, creatinine 0.65 Labs (7/19): ANA negative, transferrin saturation 11%, SPEP negative.  Labs (8/19): K 3.8, creatinine 0.77 Labs (9/19): K 4.2, creatinine 0.57 Labs (10/19): hgb 14  PMH: 1. Fibromyalgia 2. PCOS 3. Type II diabetes 4. Chronic systolic CHF: Nonischemic cardiomyopathy.  - Echo (4/19): Severe LV dilation with EF 20-25%, restrictive diastolic function.  - Coronary CTA (5/19): CAC 0, normal coronaries.  - Cardiac MRI (8/19): moderate LV dilation with EF 20%, normal RV size and systolic function, EF 60%.  No LGE.  - CPX (9/19): RER 1.03, peak VO2 19, VE/VCO2 slope 38 => submaximal study, moderately decreased functional capacity.  - Echo (11/19): EF 30%, normal RV size and systolic function, no MR.  5. Sinus tachycardia: Holter (5/19) with average HR 95, rare PVCs, no other  arrhythmias.   SH: Rare ETOH, no smoking.  Married, no children or pregnancies.  Works full time in [employer].    FH: No history of CAD or cardiomyopathy.  ROS: All systems reviewed and negative except as per HPI.   Current Outpatient Medications  Medication Sig Dispense Refill  . Biotin 1000 MCG CHEW Chew 1,000 mcg by mouth daily.    . bisoprolol (ZEBETA) 10 MG tablet Take 1 tablet (10 mg total) by mouth daily. 30 tablet 3  . canagliflozin (INVOKANA) 300 MG TABS tablet Take 300 mg by mouth daily before breakfast.    . clonazePAM (KLONOPIN) 0.5 MG tablet Take 0.5 mg by mouth 2 (two) times daily as needed for anxiety.    . furosemide (LASIX) 40 MG tablet Take 1 tab daily every other day ALTERNATING with 1/2 tab daily every other day 90 tablet 1  . glimepiride (AMARYL) 1 MG tablet TAKE 1 TABLET BY MOUTH ONCE DAILY WITH BREAKFAST 90 tablet 0  . lamoTRIgine (LAMICTAL) 25 MG tablet Take 25 mg by mouth 2 (two) times daily.  1  . levothyroxine (SYNTHROID, LEVOTHROID) 25 MCG tablet Take 25 mcg by mouth daily before breakfast.    . metFORMIN (GLUCOPHAGE) 500 MG tablet Take 500 mg by mouth 2 (two) times daily with a meal.    . norethindrone (MICRONOR,CAMILA,ERRIN) 0.35 MG tablet Take 1 tablet by mouth daily.    . pravastatin (PRAVACHOL) 40 MG tablet TAKE 1 TABLET BY  MOUTH ONCE DAILY 30 tablet 3  . sacubitril-valsartan (ENTRESTO) 49-51 MG Take 1 tablet by mouth 2 (two) times daily. 60 tablet 3  . Semaglutide (OZEMPIC) 0.25 or 0.5 MG/DOSE SOPN Inject 0.5 mg into the skin once a week.     . spironolactone (ALDACTONE) 25 MG tablet Take 1 tablet (25 mg total) by mouth daily. 90 tablet 3  . vitamin B-12 (CYANOCOBALAMIN) 500 MCG tablet Take 500 mcg by mouth daily.     No current facility-administered medications for this encounter.    BP 98/62   Pulse 96   Wt 83 kg (183 lb)   SpO2 98%   BMI 32.42 kg/m  General: NAD Neck: No JVD, no thyromegaly or thyroid nodule.  Lungs: Clear to auscultation  bilaterally with normal respiratory effort. CV: Nondisplaced PMI.  Heart regular S1/S2, no S3/S4, no murmur.  No peripheral edema.  No carotid bruit.  Normal pedal pulses.  Abdomen: Soft, nontender, no hepatosplenomegaly, no distention.  Skin: Intact without lesions or rashes.  Neurologic: Alert and oriented x 3.  Psych: Normal affect. Extremities: No clubbing or cyanosis.  HEENT: Normal.   Assessment/Plan: 1. Chronic systolic CHF: Echo in 5/19 with severe LV dilation, EF 20-25%.  Coronary CT showed no evidence for CAD => nonischemic cardiomyopathy.  No prior pregnancies, so not peri-partum cardiomyopathy.  Mild sinus tachycardia, most recent holter with avg HR 95.  Do not think this is tachy-mediated CMP.  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  Cardiac MRI in 8/19 showed EF 20%, normal RV, no myocardial LGE.  CPX with moderate functional limitation from HF.  Echo was done today and reviewed, EF 30%, persistently low.  She has NYHA class II symptoms.  She is not volume overloaded on exam. BP is relatively soft but she denies lightheadedness.  - Continue Entresto 49/51 bid.   - Increase bisoprolol to 10 mg daily.    - Continue spironolactone 25 mg daily.   - Continue Lasix 40 daily alternating with 20 daily.  - BMET today.  - Narrow QRS, will not be CRT candidate.  She has had a persistently low EF.  She has a nonischemic cardiomyopathy but is young, so I think she will benefit from ICD.  She is not sure she wants an ICD yet, may want to wait another 6 months with another echo but willing to discuss with EP. I will refer.  2. Sinus tachycardia: Resolved with medical treatment of cardiomyopathy.  3. Suspect OSA: She has a sleep study scheduled.   Followup in 3 months.   Marca Ancona 03/16/2018  -

## 2018-03-16 NOTE — Patient Instructions (Signed)
Labs done today  INCREASE bisoprolol to 10mg  (1 tab) daily.  Follow up with Dr. Shirlee Latch in 3 months.

## 2018-03-27 ENCOUNTER — Ambulatory Visit (HOSPITAL_BASED_OUTPATIENT_CLINIC_OR_DEPARTMENT_OTHER): Payer: Commercial Managed Care - PPO | Attending: Cardiology | Admitting: Cardiology

## 2018-03-27 DIAGNOSIS — R0683 Snoring: Secondary | ICD-10-CM | POA: Diagnosis present

## 2018-03-27 DIAGNOSIS — G4733 Obstructive sleep apnea (adult) (pediatric): Secondary | ICD-10-CM | POA: Diagnosis not present

## 2018-03-30 NOTE — Procedures (Signed)
   Patient Name:<br> Lydia Taylor, Lydia Taylor Study Date:04/29/2017 03/27/2018 Gender: Female D.O.B: February 07, 1976 Age (years): 41 Referring Provider: Marca Ancona Height (inches): 63 Interpreting Physician: Armanda Magic MD, ABSM Weight (lbs): 178 RPSGT: Rolene Arbour BMI: 32 MRN: [id number] Neck Size: 15.00  CLINICAL INFORMATION  Sleep Study Type: NPSG  Indication for sleep study: N/A  Epworth Sleepiness Score: 4  SLEEP STUDY TECHNIQUE  As per the AASM Manual for the Scoring of Sleep and Associated Events v2.3 (April 2016) with a hypopnea requiring 4% desaturations. The channels recorded and monitored were frontal, central and occipital EEG, electrooculogram (EOG), submentalis EMG (chin), nasal and oral airflow, thoracic and abdominal wall motion, anterior tibialis EMG, snore microphone, electrocardiogram, and pulse oximetry.  MEDICATIONS  Medications self-administered by patient taken the night of the study : N/A  SLEEP ARCHITECTURE  The study was initiated at 10:10:35 PM and ended at 4:35:04 AM. Sleep onset time was 29.8 minutes and the sleep efficiency was 80.5%%. The total sleep time was 309.7 minutes. Stage REM latency was 128.5 minutes. The patient spent 2.6%% of the night in stage N1 sleep, 78.7%% in stage N2 sleep, 0.0%% in stage N3 and 18.7% in REM. Alpha intrusion was absent. Supine sleep was 69.79%.  RESPIRATORY PARAMETERS  The overall apnea/hypopnea index (AHI) was 6.4 per hour. There were 6 total apneas, including 6 obstructive, 0 central and 0 mixed apneas. There were 27 hypopneas and 15 RERAs. The AHI during Stage REM sleep was 32.1 per hour. AHI while supine was 9.2 per hour. The mean oxygen saturation was 95.0%. The minimum SpO2 during sleep was 85.0%. moderate snoring was noted during this study.  CARDIAC DATA  The 2 lead EKG demonstrated sinus rhythm. The mean heart rate was 95.4 beats per minute. Other EKG findings include: None.  LEG MOVEMENT DATA  The  total PLMS were 0 with a resulting PLMS index of 0.0. Associated arousal with leg movement index was 12.4 .  IMPRESSIONS  - Mild obstructive sleep apnea occurred during this study (AHI = 6.4/h) but severe during REM sleep (32.1/hr). - No significant central sleep apnea occurred during this study (CAI = 0.0/h). - Mild oxygen desaturation was noted during this study (Min O2 = 85.0%). - The patient snored with moderate snoring volume. - No cardiac abnormalities were noted during this study. - Clinically significant periodic limb movements did not occur during sleep. Associated arousals were significant.  DIAGNOSIS  - Obstructive Sleep Apnea (327.23 [G47.33 ICD-10])])  RECOMMENDATIONS  - Recommend CPAP titration given severity of sleep disordered breathing during REM sleep. - Positional therapy avoiding supine position during sleep. - Avoid alcohol, sedatives and other CNS depressants that may worsen sleep apnea and disrupt normal sleep architecture. - Sleep hygiene should be reviewed to assess factors that may improve sleep quality. - Weight management and regular exercise should be initiated or continued if appropriate.  [Electronically signed] 03/30/2018 10:09 AM  Armanda Magic MD, ABSM Diplomate, American Board of Sleep Medicine

## 2018-04-03 ENCOUNTER — Telehealth: Payer: Self-pay | Admitting: *Deleted

## 2018-04-03 NOTE — Telephone Encounter (Deleted)
-----   Message from Traci R Turner, MD sent at 03/30/2018 10:11 AM EST ----- Please let patient know that they have sleep apnea and recommend CPAP titration. Please set up titration in the sleep lab. 

## 2018-04-03 NOTE — Telephone Encounter (Signed)
-----   Message from Reesa Chew, CMA sent at 04/03/2018 11:44 AM EST ----- Regarding: new order cpap titration

## 2018-04-03 NOTE — Telephone Encounter (Signed)
Called results lmtcb. 

## 2018-04-03 NOTE — Telephone Encounter (Signed)
-----   Message from Quintella Reichert, MD sent at 03/30/2018 10:11 AM EST ----- Please let patient know that they have sleep apnea and recommend CPAP titration. Please set up titration in the sleep lab.

## 2018-04-03 NOTE — Telephone Encounter (Signed)
Staff message sent to [facility] per [facility] no PA is required for CPAP titration study. Ok to schedule. Call reference # [id number].

## 2018-04-03 NOTE — Telephone Encounter (Signed)
Return call: Informed patient of sleep study results and patient understanding was verbalized. Patient understands her sleep study showed they have sleep apnea and recommend CPAP titration and set up titration in the sleep lab.   Titration sent to precert

## 2018-04-03 NOTE — Telephone Encounter (Signed)
Called results lmtcb on cell. 

## 2018-04-07 ENCOUNTER — Telehealth: Payer: Self-pay | Admitting: *Deleted

## 2018-04-07 DIAGNOSIS — R5383 Other fatigue: Secondary | ICD-10-CM

## 2018-04-07 DIAGNOSIS — R0683 Snoring: Secondary | ICD-10-CM

## 2018-04-07 NOTE — Telephone Encounter (Signed)
-----   Message from Gaynelle Cage, CMA sent at 04/03/2018  2:08 PM EST ----- Regarding: RE: new order Per UMR no PA is required. Call reference # [id number]. Ok to schedule CPAP titration. ----- Message ----- From: Reesa Chew, CMA Sent: 04/03/2018  11:44 AM EST To: [facility] Sleep Studies Subject: new order                                      cpap titration

## 2018-04-08 ENCOUNTER — Encounter: Payer: Self-pay | Admitting: *Deleted

## 2018-04-08 NOTE — Telephone Encounter (Signed)
Patient is scheduled for CPAP Titration on 06/01/2018. Patient understands her titration study will be done at [facility] sleep lab. Patient understands she will receive a letter in a week or so detailing appointment, date, time, and location. Patient understands to call if she does not receive the letter  in a timely manner.  Left detailed message on voicemail with date and time of titration and informed patient to call back to confirm or reschedule.

## 2018-04-13 ENCOUNTER — Telehealth (HOSPITAL_COMMUNITY): Payer: Self-pay | Admitting: Pharmacist

## 2018-04-13 ENCOUNTER — Other Ambulatory Visit (HOSPITAL_COMMUNITY): Payer: Self-pay | Admitting: Pharmacist

## 2018-04-13 MED ORDER — BISOPROLOL FUMARATE 10 MG PO TABS: 10.0000 mg | ORAL_TABLET | Freq: Every day | ORAL | 11 refills | Status: DC

## 2018-04-13 NOTE — Telephone Encounter (Signed)
Lydia Taylor called asking if she could start taking milk thistle. I have advised her that since herbal supplements are not regulated, I can't speak to their efficacy or safety but the literature that is currently available for milk thistle does not suggest that there are any interactions with any of her current medications. She should watch out for side effects reported including diarrhea, gas, indigestion, itching, nausea and rash.   Tyler Deis. Bonnye Fava, PharmD, BCPS, CPP Clinical Pharmacist Phone: [phone] 04/13/2018 10:14 AM

## 2018-05-23 ENCOUNTER — Other Ambulatory Visit (HOSPITAL_COMMUNITY): Payer: Self-pay | Admitting: Cardiology

## 2018-06-01 ENCOUNTER — Ambulatory Visit (HOSPITAL_BASED_OUTPATIENT_CLINIC_OR_DEPARTMENT_OTHER): Payer: Commercial Managed Care - PPO | Attending: Cardiology | Admitting: Cardiology

## 2018-06-01 ENCOUNTER — Encounter

## 2018-06-01 DIAGNOSIS — R5383 Other fatigue: Secondary | ICD-10-CM | POA: Insufficient documentation

## 2018-06-01 DIAGNOSIS — G4733 Obstructive sleep apnea (adult) (pediatric): Secondary | ICD-10-CM | POA: Insufficient documentation

## 2018-06-01 DIAGNOSIS — R0683 Snoring: Secondary | ICD-10-CM | POA: Diagnosis not present

## 2018-06-02 ENCOUNTER — Telehealth (HOSPITAL_COMMUNITY): Payer: Self-pay | Admitting: Cardiology

## 2018-06-02 NOTE — Telephone Encounter (Signed)
Left message for patient to call back.  Need to reschedule pt from 06/19/2018 with Dr. Shirlee Latch, as he will be out of Clinic.  Dr. Shirlee Latch will see pts on 06/17/2018 or 06/18/2018

## 2018-06-02 NOTE — Procedures (Signed)
   Patient Name: Lydia Taylor, Case Date: 06/01/2018 Gender: Female D.O.B: 01/29/1976 Age (years): 42 Referring Provider: Marca Ancona Height (inches): 63 Interpreting Physician: Armanda Magic MD, ABSM Weight (lbs): 178 RPSGT: Ulyess Mort BMI: 32 MRN: [id number] Neck Size: 14.00  CLINICAL INFORMATION The patient is referred for a CPAP titration to treat sleep apnea.  SLEEP STUDY TECHNIQUE As per the AASM Manual for the Scoring of Sleep and Associated Events v2.3 (April 2016) with a hypopnea requiring 4% desaturations.  The channels recorded and monitored were frontal, central and occipital EEG, electrooculogram (EOG), submentalis EMG (chin), nasal and oral airflow, thoracic and abdominal wall motion, anterior tibialis EMG, snore microphone, electrocardiogram, and pulse oximetry. Continuous positive airway pressure (CPAP) was initiated at the beginning of the study and titrated to treat sleep-disordered breathing.  MEDICATIONS Medications self-administered by patient taken the night of the study : BELSOMRA  TECHNICIAN COMMENTS Comments added by technician: PATIENT WAS ORDERED AS A CPAP TITRATION. Comments added by scorer: N/A  RESPIRATORY PARAMETERS Optimal PAP Pressure (cm): 13  AHI at Optimal Pressure (/hr):0.0 Overall Minimal O2 (%):89.0  Supine % at Optimal Pressure (%):0 Minimal O2 at Optimal Pressure (%): 96.0   SLEEP ARCHITECTURE The study was initiated at 9:59:22 PM and ended at 4:48:04 AM.  Sleep onset time was 58.9 minutes and the sleep efficiency was 80.6%. The total sleep time was 329.5 minutes.  The patient spent 7.3% of the night in stage N1 sleep, 83.5% in stage N2 sleep, 0.2% in stage N3 and 9.1% in REM.Stage REM latency was 62.5 minutes  Wake after sleep onset was 20.3. Alpha intrusion was absent. Supine sleep was 60.85%.  CARDIAC DATA The 2 lead EKG demonstrated sinus rhythm. The mean heart rate was 91.2 beats per minute. Other EKG findings  include: None.  LEG MOVEMENT DATA The total Periodic Limb Movements of Sleep (PLMS) were 0. The PLMS index was 0.0. A PLMS index of <15 is considered normal in adults.  IMPRESSIONS - The optimal PAP pressure was 13 cm of water. - Central sleep apnea was not noted during this titration (CAI = 0.2/h). - Mild oxygen desaturations were observed during this titration (min O2 = 89.0%). - The patient snored with soft snoring volume during this titration study. - No cardiac abnormalities were observed during this study. - Clinically significant periodic limb movements were not noted during this study. Arousals associated with PLMs were rare.  DIAGNOSIS - Obstructive Sleep Apnea (327.23 [G47.33 ICD-10])  RECOMMENDATIONS - Trial of CPAP therapy on 13 cm H2O with a Small size Resmed Nasal Pillow Mask AirFit P30 mask and heated humidification. - Avoid alcohol, sedatives and other CNS depressants that may worsen sleep apnea and disrupt normal sleep architecture. - Sleep hygiene should be reviewed to assess factors that may improve sleep quality. - Weight management and regular exercise should be initiated or continued. - Return to Sleep Center for re-evaluation after 10 weeks of therapy  [Electronically signed] 06/02/2018 01:30 PM  Armanda Magic MD, ABSM Diplomate, American Board of Sleep Medicine

## 2018-06-03 NOTE — Telephone Encounter (Signed)
Patient returned my call.  She is aware of appt on 06/17/2018.

## 2018-06-04 ENCOUNTER — Telehealth: Payer: Self-pay | Admitting: *Deleted

## 2018-06-04 NOTE — Telephone Encounter (Signed)
Informed patient of sleep study results and patient understanding was verbalized. Patient understands her sleep study showed they had a successful PAP titration and orders are in EPIC. Please set up 10 week OV with me.  Upon patient request DME selection is [facility]. Patient understands she will be contacted by [facility] to set up her cpap. Patient understands to call if [facility] does not contact her with new setup in a timely manner. Patient understands they will be called once confirmation has been received from [facility] that they have received their new machine to schedule 10 week follow up appointment.  [facility] notified of new cpap order  Please add to airview Patient was grateful for the call and thanked me. 

## 2018-06-04 NOTE — Telephone Encounter (Signed)
-----   Message from Quintella Reichert, MD sent at 06/02/2018  1:33 PM EST ----- Please let patient know that they had a successful PAP titration and let DME know that orders are in EPIC.  Please set up 10 week OV with me.

## 2018-06-04 NOTE — Telephone Encounter (Signed)
Called results lmcb.

## 2018-06-17 ENCOUNTER — Ambulatory Visit (HOSPITAL_COMMUNITY)
Admission: RE | Admit: 2018-06-17 | Discharge: 2018-06-17 | Disposition: A | Discharge status: Home / Self Care | Payer: Commercial Managed Care - PPO | Source: Ambulatory Visit | Attending: Cardiology | Admitting: Cardiology

## 2018-06-17 VITALS — BP 106/62 | HR 108 | Wt 185.0 lb

## 2018-06-17 DIAGNOSIS — Z79890 Hormone replacement therapy (postmenopausal): Secondary | ICD-10-CM | POA: Insufficient documentation

## 2018-06-17 DIAGNOSIS — M797 Fibromyalgia: Secondary | ICD-10-CM | POA: Diagnosis not present

## 2018-06-17 DIAGNOSIS — Z79899 Other long term (current) drug therapy: Secondary | ICD-10-CM | POA: Diagnosis not present

## 2018-06-17 DIAGNOSIS — I5022 Chronic systolic (congestive) heart failure: Secondary | ICD-10-CM

## 2018-06-17 DIAGNOSIS — I519 Heart disease, unspecified: Secondary | ICD-10-CM | POA: Diagnosis present

## 2018-06-17 DIAGNOSIS — G4733 Obstructive sleep apnea (adult) (pediatric): Secondary | ICD-10-CM | POA: Insufficient documentation

## 2018-06-17 DIAGNOSIS — I428 Other cardiomyopathies: Secondary | ICD-10-CM | POA: Insufficient documentation

## 2018-06-17 DIAGNOSIS — E119 Type 2 diabetes mellitus without complications: Secondary | ICD-10-CM | POA: Diagnosis not present

## 2018-06-17 DIAGNOSIS — R000 Tachycardia, unspecified: Secondary | ICD-10-CM | POA: Insufficient documentation

## 2018-06-17 DIAGNOSIS — Z7984 Long term (current) use of oral hypoglycemic drugs: Secondary | ICD-10-CM | POA: Diagnosis not present

## 2018-06-17 LAB — BASIC METABOLIC PANEL
ANION GAP: 10 (ref 5–15)
BUN: 9 mg/dL (ref 6–20)
CHLORIDE: 109 mmol/L (ref 98–111)
CO2: 21 mmol/L — AB (ref 22–32)
Calcium: 9.7 mg/dL (ref 8.9–10.3)
Creatinine, Ser: 0.57 mg/dL (ref 0.44–1.00)
GFR calc non Af Amer: >60 mL/min (ref >60)
GLUCOSE: 131 mg/dL — AB (ref 70–99)
Potassium: 4.3 mmol/L (ref 3.5–5.1)
Sodium: 140 mmol/L (ref 135–145)

## 2018-06-17 MED ORDER — IVABRADINE HCL 5 MG PO TABS: 5.0000 mg | ORAL_TABLET | Freq: Two times a day (BID) | ORAL | 5 refills | Status: DC

## 2018-06-17 MED ORDER — FUROSEMIDE 20 MG PO TABS: 20.0000 mg | ORAL_TABLET | Freq: Three times a day (TID) | ORAL | 3 refills | Status: DC

## 2018-06-17 MED ORDER — FUROSEMIDE 20 MG PO TABS: 20.0000 mg | ORAL_TABLET | Freq: Two times a day (BID) | ORAL | 3 refills | Status: DC

## 2018-06-17 NOTE — Progress Notes (Signed)
PCP: Dr. Hal Hope Cardiology: Dr. Allyson Sabal HF Cardiology: Dr. Shirlee Latch  43 y.o. with history of diabetes and fibromyalgia was referred by Dr. Allyson Sabal for evaluation of CHF.  For about 2 years, she has noted an elevated heart rate.  For over a year, she had noted dyspnea walking about 50 yards ("halfway around the grocery store").  She avoids stairs due to dyspnea.  She recalls an episode about 2 years ago when she developed severe chest pain and dyspnea but did not seek medical care.  She continues to work full time.  She does not drink ETOH or use drugs.  She has never been pregnant.  She has significant daytime sleepiness.   Cardiac MRI was done in 8/19 showing EF 20% with preserved RV function, no LGE.    Repeat echo was done in 11/19 showing EF 30% with normal RV size and systolic function.   She returns for followup of CHF.  She was recently diagnosed with OSA and will get CPAP tomorrow.  Dyspnea is stable, only notes with moderate-heavy exertion.  Fibromyalgia symptoms have been bothering her recently.  She has been taking Lasix 60 mg daily and feels like this dose has been keeping her volume controlled.  Weight is up 2 lbs since last appointment.  No orthopnea/PND.    Labs (5/19): K 4.8, creatinine 0.65 Labs (7/19): ANA negative, transferrin saturation 11%, SPEP negative.  Labs (8/19): K 3.8, creatinine 0.77 Labs (9/19): K 4.2, creatinine 0.57 Labs (10/19): hgb 14  PMH: 1. Fibromyalgia 2. PCOS 3. Type II diabetes 4. Chronic systolic CHF: Nonischemic cardiomyopathy.  - Echo (4/19): Severe LV dilation with EF 20-25%, restrictive diastolic function.  - Coronary CTA (5/19): CAC 0, normal coronaries.  - Cardiac MRI (8/19): moderate LV dilation with EF 20%, normal RV size and systolic function, EF 60%.  No LGE.  - CPX (9/19): RER 1.03, peak VO2 19, VE/VCO2 slope 38 => submaximal study, moderately decreased functional capacity.  - Echo (11/19): EF 30%, normal RV size and systolic function, no MR.   5. Sinus tachycardia: Holter (5/19) with average HR 95, rare PVCs, no other arrhythmias.  6. OSA  SH: Rare ETOH, no smoking.  Married, no children or pregnancies.  Works full time in [employer].    FH: No history of CAD or cardiomyopathy.  ROS: All systems reviewed and negative except as per HPI.   Current Outpatient Medications  Medication Sig Dispense Refill  . Biotin 1000 MCG CHEW Chew 1,000 mcg by mouth daily.    . bisoprolol (ZEBETA) 10 MG tablet Take 1 tablet (10 mg total) by mouth daily. 30 tablet 11  . canagliflozin (INVOKANA) 300 MG TABS tablet Take 300 mg by mouth daily before breakfast.    . clonazePAM (KLONOPIN) 0.5 MG tablet Take 0.5 mg by mouth 2 (two) times daily as needed for anxiety.    Marland Kitchen ENTRESTO 49-51 MG TAKE 1 TABLET BY MOUTH TWICE DAILY 60 tablet 0  . furosemide (LASIX) 20 MG tablet Take 1 tablet (20 mg total) by mouth 3 (three) times daily. 90 tablet 3  . glimepiride (AMARYL) 1 MG tablet TAKE 1 TABLET BY MOUTH ONCE DAILY WITH BREAKFAST 90 tablet 0  . ivabradine (CORLANOR) 5 MG TABS tablet Take 1 tablet (5 mg total) by mouth 2 (two) times daily with a meal. 60 tablet 5  . lamoTRIgine (LAMICTAL) 25 MG tablet Take 25 mg by mouth 2 (two) times daily.  1  . levothyroxine (SYNTHROID, LEVOTHROID) 25 MCG tablet Take 25  mcg by mouth daily before breakfast.    . metFORMIN (GLUCOPHAGE) 500 MG tablet Take 500 mg by mouth 2 (two) times daily with a meal.    . norethindrone (MICRONOR,CAMILA,ERRIN) 0.35 MG tablet Take 1 tablet by mouth daily.    . pravastatin (PRAVACHOL) 40 MG tablet TAKE 1 TABLET BY MOUTH ONCE DAILY 30 tablet 3  . Semaglutide (OZEMPIC) 0.25 or 0.5 MG/DOSE SOPN Inject 0.5 mg into the skin once a week.     . vitamin B-12 (CYANOCOBALAMIN) 500 MCG tablet Take 500 mcg by mouth daily.    Marland Kitchen spironolactone (ALDACTONE) 25 MG tablet Take 1 tablet (25 mg total) by mouth daily. 90 tablet 3   No current facility-administered medications for this encounter.    BP 106/62   Pulse  (!) 108   Wt 83.9 kg (185 lb)   SpO2 98%   BMI 32.77 kg/m  General: NAD Neck: No JVD, no thyromegaly or thyroid nodule.  Lungs: Clear to auscultation bilaterally with normal respiratory effort. CV: Nondisplaced PMI.  Heart regular S1/S2, no S3/S4, no murmur.  No peripheral edema.  No carotid bruit.  Normal pedal pulses.  Abdomen: Soft, nontender, no hepatosplenomegaly, no distention.  Skin: Intact without lesions or rashes.  Neurologic: Alert and oriented x 3.  Psych: Normal affect. Extremities: No clubbing or cyanosis.  HEENT: Normal.   Assessment/Plan: 1. Chronic systolic CHF: Echo in 5/19 with severe LV dilation, EF 20-25%.  Coronary CT showed no evidence for CAD => nonischemic cardiomyopathy.  No prior pregnancies, so not peri-partum cardiomyopathy.  Mild sinus tachycardia, most recent holter with avg HR 95.  Do not think this is tachy-mediated CMP.  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  Cardiac MRI in 8/19 showed EF 20%, normal RV, no myocardial LGE.  CPX with moderate functional limitation from HF.  Echo in 11/19 showed EF 30%, persistently low.  She has NYHA class II symptoms.  She is not volume overloaded on exam.  - Continue Entresto 49/51 bid, BP probably too soft to increase.   - Continue bisoprolol 10 mg daily.    - Continue spironolactone 25 mg daily.   - Continue Lasix 60 mg daily, BMET today.   - With HR in 90s at rest, I will add Corlanor 5 mg bid.  - Narrow QRS, will not be CRT candidate.  She has had a persistently low EF.  She has a nonischemic cardiomyopathy but is young, so I think she will benefit from ICD.  Repeat echo in 3/20, if EF remains low will recommend ICD and refer to EP (she has been reluctant).  - Refer to cardiac rehab.  2. OSA: Awaiting CPAP.   Followup in 3/20 with echo.   Lydia Taylor 06/17/2018  -

## 2018-06-17 NOTE — Patient Instructions (Signed)
Take Corlanor 5mg  twice daily.  Routine lab work today. Will notify you of abnormal results  You have been referred to Cardiac Rehab. (they will contact you to schedule appointment)  Follow up and Echocardiogram in March.

## 2018-06-19 ENCOUNTER — Encounter (HOSPITAL_COMMUNITY): Payer: Commercial Managed Care - PPO | Admitting: Cardiology

## 2018-06-19 ENCOUNTER — Telehealth (HOSPITAL_COMMUNITY): Payer: Self-pay

## 2018-06-19 NOTE — Telephone Encounter (Signed)
Patient has a 10 week follow up appointment scheduled for 09/03/18. Patient understands she needs to keep this appointment for insurance compliance. Patient was grateful for the call and thanked me.  

## 2018-06-19 NOTE — Telephone Encounter (Signed)
Prior authorization through [employer] company was initiated for corlanor medication and sent via [email or web address] on 06/19/2018.

## 2018-07-01 ENCOUNTER — Telehealth (HOSPITAL_COMMUNITY): Payer: Self-pay

## 2018-07-01 NOTE — Telephone Encounter (Signed)
Pt insurance is active and benefits verified through [facility]. Co-pay $0.00, DED $2,500.00/$2,500.00 met, out of pocket $5,500.00/$3,360.71 met, co-insurance 20%. No pre-authorization required. [place], 07/01/2018 @ 3:37PM, REF# [id number]

## 2018-07-02 ENCOUNTER — Other Ambulatory Visit (HOSPITAL_COMMUNITY): Payer: Self-pay | Admitting: Cardiology

## 2018-07-03 NOTE — Telephone Encounter (Signed)
Attempted to call patient in regards to Cardiac Rehab - LM on VM °Gloria W. Support Rep II °

## 2018-07-03 NOTE — Telephone Encounter (Signed)
Pt called back and she was interested in participating in the Cardiac Rehab Program. Patient stated yes. Patient will come in for orientation on 07/28/2018 @ 8:30am and will attend the 1:15pm exercise class.  Mailed homework package. Tempie Donning. Support Rep II

## 2018-07-06 ENCOUNTER — Other Ambulatory Visit (HOSPITAL_COMMUNITY): Payer: Self-pay | Admitting: Cardiology

## 2018-07-08 NOTE — Telephone Encounter (Signed)
Pt called inquiring about PA for Corlanor.  [employer] called, they state they need patient chart information to justify need for medicine.  Last visit note and ECHO results faxed to [phone].  Will check status by end of week.

## 2018-07-13 ENCOUNTER — Encounter (HOSPITAL_COMMUNITY): Payer: Self-pay | Admitting: *Deleted

## 2018-07-13 NOTE — Telephone Encounter (Signed)
Corlanor was denied, an appeal letter was completed and faxed to [employer] at [phone].  Pt is aware, she will come by office and pick up samples so that she can get started on med.  Medication Samples have been provided to the patient.  Drug name: Corlanor     Strength: 5mg         Qty: 2  [id number]  Exp.Date: 5/24  Dosing instructions: Take 1 tab Twice daily   The patient has been instructed regarding the correct time, dose, and frequency of taking this medication, including desired effects and most common side effects.   Lydia Taylor 9:43 AM 07/13/2018

## 2018-07-22 ENCOUNTER — Telehealth (HOSPITAL_COMMUNITY): Payer: Self-pay

## 2018-07-22 NOTE — Telephone Encounter (Signed)
Cardiac Rehab Medication Review by a Pharmacist  Does the patient feel that his/her medications are working for him/her?  yes  Has the patient been experiencing any side effects to the medications prescribed?  no  Does the patient measure his/her own blood pressure or blood glucose at home?  Yes, BP 90-100s/60-70s, BG (not too often) - 116, 90s  Does the patient have any problems obtaining medications due to transportation or finances?   Patient currently taking samples of ivabradine. Was rejected by insurance for coverage, but reports that MD team is working on appeal. She thinks she has enough samples to get her through to her HF appointment next week.    Understanding of regimen: good Understanding of indications: good Potential of compliance: good    Pharmacist comments: Removed Invokana and added Jardiance to medication list. Patient reports taking lasix 60 mg PO daily and metformin QID.    Arvilla Market, PharmD PGY1 Pharmacy Resident Phone [phone] 07/22/2018     5:01 PM

## 2018-07-24 NOTE — Progress Notes (Signed)
Lydia Taylor 43 y.o. female DOB 12/25/1975 MRN [id number]       Nutrition Screen Note  No diagnosis found. Past Medical History:  Diagnosis Date  . Chicken pox   . Diabetes mellitus without complication (HCC)   . DM (diabetes mellitus), type 2 (HCC)   . Fibromyalgia   . Mood disorder (HCC)   . PCOS (polycystic ovarian syndrome)   . Sinus tachycardia    Meds reviewed.    Current Outpatient Medications (Endocrine & Metabolic):  .  empagliflozin (JARDIANCE) 25 MG TABS tablet, Take 25 mg by mouth daily. Marland Kitchen  glimepiride (AMARYL) 1 MG tablet, TAKE 1 TABLET BY MOUTH ONCE DAILY WITH BREAKFAST .  levothyroxine (SYNTHROID, LEVOTHROID) 25 MCG tablet, Take 25 mcg by mouth daily before breakfast. .  metFORMIN (GLUCOPHAGE) 500 MG tablet, Take 500 mg by mouth 4 (four) times daily.  .  norethindrone (MICRONOR,CAMILA,ERRIN) 0.35 MG tablet, Take 1 tablet by mouth daily. .  Semaglutide (OZEMPIC) 0.25 or 0.5 MG/DOSE SOPN, Inject 0.5 mg into the skin once a week.   Current Outpatient Medications (Cardiovascular):  .  bisoprolol (ZEBETA) 10 MG tablet, Take 1 tablet (10 mg total) by mouth daily. Marland Kitchen  ENTRESTO 49-51 MG, TAKE 1 TABLET BY MOUTH TWICE DAILY .  furosemide (LASIX) 20 MG tablet, Take 1 tablet (20 mg total) by mouth 3 (three) times daily. (Patient taking differently: Take 60 mg by mouth daily. ) .  ivabradine (CORLANOR) 5 MG TABS tablet, Take 1 tablet (5 mg total) by mouth 2 (two) times daily with a meal. .  pravastatin (PRAVACHOL) 40 MG tablet, TAKE 1 TABLET BY MOUTH ONCE DAILY .  spironolactone (ALDACTONE) 25 MG tablet, Take 1 tablet (25 mg total) by mouth daily.    Current Outpatient Medications (Hematological):  .  vitamin B-12 (CYANOCOBALAMIN) 500 MCG tablet, Take 500 mcg by mouth daily.  Current Outpatient Medications (Other):  .  Biotin 1000 MCG CHEW, Chew 1,000 mcg by mouth daily. .  clonazePAM (KLONOPIN) 0.5 MG tablet, Take 0.5 mg by mouth 2 (two) times daily as needed for  anxiety. .  lamoTRIgine (LAMICTAL) 25 MG tablet, Take 100 mg by mouth 2 (two) times daily.    HT: Ht Readings from Last 1 Encounters:  06/01/18 5\' 3"  (1.6 m)    WT: Wt Readings from Last 5 Encounters:  06/17/18 185 lb (83.9 kg)  06/01/18 178 lb (80.7 kg)  03/27/18 178 lb (80.7 kg)  03/16/18 183 lb (83 kg)  02/10/18 181 lb 9.6 oz (82.4 kg)     BMI = 33.31  Current tobacco use? No       Labs:  Lipid Panel     Component Value Date/Time   CHOL 202 (H) 06/21/2016 0910   TRIG 119.0 06/21/2016 0910   HDL 46.50 06/21/2016 0910   CHOLHDL 4 06/21/2016 0910   VLDL 23.8 06/21/2016 0910   LDLCALC 132 (H) 06/21/2016 0910    Lab Results  Component Value Date   HGBA1C 8.0 05/27/2016   CBG (last 3)  No results for input(s): GLUCAP in the last 72 hours.  Nutrition Diagnosis ? Food-and nutrition-related knowledge deficit related to lack of exposure to information as related to diagnosis of: ? CVD ? Type 2 Diabetes ? Obese  I = 30-34.9 related to excessive energy intake as evidenced by a BMI = 33.31  Nutrition Goal(s):  ? To be determined  Plan:  Pt to attend nutrition classes ? Nutrition I ? Nutrition II ? Portion Distortion  ?  Diabetes Blitz ? Diabetes Q & A Will provide client-centered nutrition education as part of interdisciplinary care.   Monitor and evaluate progress toward nutrition goal with team.  Ross Marcus, MS, RD, LDN 07/24/2018 9:03 AM

## 2018-07-27 ENCOUNTER — Telehealth (HOSPITAL_COMMUNITY): Payer: Self-pay | Admitting: *Deleted

## 2018-07-27 NOTE — Telephone Encounter (Signed)
Pt called to update about closure of cardiac rehab for two weeks. Pt will be rescheduled for orientation when cardiac rehab reopens.  Pt verbalized understanding and appreciative for call.

## 2018-07-28 ENCOUNTER — Inpatient Hospital Stay (HOSPITAL_COMMUNITY)
Admission: RE | Admit: 2018-07-28 | Discharge: 2018-07-28 | Disposition: A | Discharge status: Home / Self Care | Payer: Commercial Managed Care - PPO | Source: Ambulatory Visit

## 2018-07-29 ENCOUNTER — Encounter (HOSPITAL_COMMUNITY): Payer: Commercial Managed Care - PPO | Admitting: Cardiology

## 2018-07-29 ENCOUNTER — Other Ambulatory Visit (HOSPITAL_COMMUNITY): Payer: Commercial Managed Care - PPO

## 2018-07-30 ENCOUNTER — Encounter (HOSPITAL_COMMUNITY): Payer: Commercial Managed Care - PPO | Admitting: Cardiology

## 2018-07-30 ENCOUNTER — Ambulatory Visit (HOSPITAL_COMMUNITY): Payer: Commercial Managed Care - PPO

## 2018-08-07 ENCOUNTER — Ambulatory Visit (HOSPITAL_COMMUNITY): Payer: Commercial Managed Care - PPO

## 2018-08-10 ENCOUNTER — Ambulatory Visit (HOSPITAL_COMMUNITY): Payer: Commercial Managed Care - PPO

## 2018-08-12 ENCOUNTER — Ambulatory Visit (HOSPITAL_COMMUNITY): Payer: Commercial Managed Care - PPO

## 2018-08-14 ENCOUNTER — Ambulatory Visit (HOSPITAL_COMMUNITY): Payer: Commercial Managed Care - PPO

## 2018-08-17 ENCOUNTER — Ambulatory Visit (HOSPITAL_COMMUNITY): Payer: Commercial Managed Care - PPO

## 2018-08-19 ENCOUNTER — Ambulatory Visit (HOSPITAL_COMMUNITY): Payer: Commercial Managed Care - PPO

## 2018-08-21 ENCOUNTER — Ambulatory Visit (HOSPITAL_COMMUNITY): Payer: Commercial Managed Care - PPO

## 2018-08-24 ENCOUNTER — Ambulatory Visit (HOSPITAL_COMMUNITY): Payer: Commercial Managed Care - PPO

## 2018-08-25 ENCOUNTER — Telehealth (HOSPITAL_COMMUNITY): Payer: Self-pay | Admitting: *Deleted

## 2018-08-25 NOTE — Telephone Encounter (Signed)
Unable to leave message for pt on his mobile number listed - voicemail is full and can not accept any messages. Tried listed home number - not a working number.  Unable to make contact with pt to advise him that Cardiac Rehab remains closed for patients in adherence to the national recommendations for Covid-19. Alanson Aly, BSN Cardiac and Emergency planning/management officer

## 2018-08-26 ENCOUNTER — Ambulatory Visit (HOSPITAL_COMMUNITY): Payer: Commercial Managed Care - PPO

## 2018-08-27 ENCOUNTER — Encounter (HOSPITAL_COMMUNITY): Payer: Self-pay

## 2018-08-27 ENCOUNTER — Other Ambulatory Visit (HOSPITAL_COMMUNITY): Payer: Self-pay | Admitting: Cardiology

## 2018-08-28 ENCOUNTER — Ambulatory Visit (HOSPITAL_COMMUNITY): Payer: Commercial Managed Care - PPO

## 2018-08-31 ENCOUNTER — Ambulatory Visit (HOSPITAL_COMMUNITY): Payer: Commercial Managed Care - PPO

## 2018-09-01 ENCOUNTER — Telehealth: Payer: Self-pay

## 2018-09-01 NOTE — Telephone Encounter (Signed)
Left message for pt to call back about virtual visit options for her appt.

## 2018-09-01 NOTE — Telephone Encounter (Signed)
°  Patient returned call, declined web visit, requested TELEphone visit

## 2018-09-02 ENCOUNTER — Ambulatory Visit (HOSPITAL_COMMUNITY): Payer: Commercial Managed Care - PPO

## 2018-09-02 ENCOUNTER — Encounter: Payer: Self-pay | Admitting: Cardiology

## 2018-09-02 DIAGNOSIS — G4733 Obstructive sleep apnea (adult) (pediatric): Secondary | ICD-10-CM

## 2018-09-02 DIAGNOSIS — Z9989 Dependence on other enabling machines and devices: Principal | ICD-10-CM

## 2018-09-02 DIAGNOSIS — I5022 Chronic systolic (congestive) heart failure: Secondary | ICD-10-CM | POA: Insufficient documentation

## 2018-09-02 HISTORY — DX: Obstructive sleep apnea (adult) (pediatric): G47.33

## 2018-09-02 NOTE — Progress Notes (Signed)
Virtual Visit via Video Note   This visit type was conducted due to national recommendations for restrictions regarding the COVID-19 Pandemic (e.g. social distancing) in an effort to limit this patient's exposure and mitigate transmission in our community.  Due to her co-morbid illnesses, this patient is at least at moderate risk for complications without adequate follow up.  This format is felt to be most appropriate for this patient at this time.  All issues noted in this document were discussed and addressed.  A limited physical exam was performed with this format.  Please refer to the patient's chart for her consent to telehealth for [facility].  Evaluation Performed:  Follow-up visit  This visit type was conducted due to national recommendations for restrictions regarding the COVID-19 Pandemic (e.g. social distancing).  This format is felt to be most appropriate for this patient at this time.  All issues noted in this document were discussed and addressed.  No physical exam was performed (except for noted visual exam findings with Video Visits).  Please refer to the patient's chart (MyChart message for video visits and phone note for telephone visits) for the patient's consent to telehealth for [facility].  Date:  09/03/2018   ID:  Lydia Taylor, DOB 1975-09-02, MRN [id number]  Patient Location:  Home  Provider location:   [place]  PCP:  Dois Davenport, MD  Cardiologist:  Marca Ancona, MD Sleep Medicine:  Armanda Magic, MD Electrophysiologist:  None   Chief Complaint:  OSA  History of Present Illness:    Lydia Taylor is a 43 y.o. female who presents via audio/video conferencing for a telehealth visit today.    This is a 43yo female with a history of DM type 2, PCOS and chronic systolic CHF with EF 20-25% with coronary CTA showing no CAD who was referred for evaluation of sleep apnea.  She underwent PSG showing mild OSA with an AHI of 6.4/hr and O2  desaturations as low as 85% and underwent CPAP titration to 13cm H2O.  She is doing well with his CPAP device and thinks that he has gotten used to it.  She did not like the nasal pillow mask because she is a mouth breather and the chin strap was irritating.  She just got a full face mask today which she is going to try.  She  feels the pressure is adequate.  Since going on CPAP she feels rested in the am and has no significant daytime sleepiness.  She denies any significant mouth or nasal dryness or nasal congestion.  She does not think that he snores.    In regards to her CHF, she is doing well.  She had some mild SOB and edema and felt mildly volume overloaded a few days ago but upped her diuretic and this has resolved.  She has not had and chest pain or pressure, PND, orthopnea, dizziness or palpitations.  The patient does not have symptoms concerning for COVID-19 infection (fever, chills, cough, or new shortness of breath).    Prior CV studies:   The following studies were reviewed today:  PAP compliance download  Past Medical History:  Diagnosis Date  . Chicken pox   . Diabetes mellitus without complication (HCC)   . DM (diabetes mellitus), type 2 (HCC)   . Fibromyalgia   . Mood disorder (HCC)   . OSA on CPAP 09/02/2018    mild OSA with an AHI of 6.4/hr and O2 desaturations as low as 85% and underwent  CPAP titration to 13cm H2O.  Marland Kitchen PCOS (polycystic ovarian syndrome)   . Sinus tachycardia    Past Surgical History:  Procedure Laterality Date  . BREAST REDUCTION SURGERY    . WISDOM TOOTH EXTRACTION       Current Meds  Medication Sig  . Biotin 1000 MCG CHEW Chew 1,000 mcg by mouth daily.  . bisoprolol (ZEBETA) 10 MG tablet Take 1 tablet (10 mg total) by mouth daily.  . clonazePAM (KLONOPIN) 0.5 MG tablet Take 0.5 mg by mouth 2 (two) times daily as needed for anxiety.  . empagliflozin (JARDIANCE) 25 MG TABS tablet Take 25 mg by mouth daily.  Marland Kitchen ENTRESTO 49-51 MG TAKE 1 TABLET BY  MOUTH TWICE DAILY  . furosemide (LASIX) 20 MG tablet TAKE 1 TABLET BY MOUTH 3  TIMES DAILY  . glimepiride (AMARYL) 1 MG tablet TAKE 1 TABLET BY MOUTH ONCE DAILY WITH BREAKFAST  . lamoTRIgine (LAMICTAL) 25 MG tablet Take 100 mg by mouth 2 (two) times daily.   Marland Kitchen levothyroxine (SYNTHROID, LEVOTHROID) 25 MCG tablet Take 25 mcg by mouth daily before breakfast.  . metFORMIN (GLUCOPHAGE) 500 MG tablet Take 500 mg by mouth 4 (four) times daily.   . norethindrone (MICRONOR,CAMILA,ERRIN) 0.35 MG tablet Take 1 tablet by mouth daily.  . pravastatin (PRAVACHOL) 40 MG tablet TAKE 1 TABLET BY MOUTH ONCE DAILY  . Semaglutide (OZEMPIC) 0.25 or 0.5 MG/DOSE SOPN Inject 0.5 mg into the skin once a week.   . spironolactone (ALDACTONE) 25 MG tablet Take 1 tablet (25 mg total) by mouth daily.  . vitamin B-12 (CYANOCOBALAMIN) 500 MCG tablet Take 500 mcg by mouth daily.     Allergies:   Amoxicillin   Social History   Tobacco Use  . Smoking status: Never Smoker  . Smokeless tobacco: Never Used  Substance Use Topics  . Alcohol use: Yes    Alcohol/week: 0.0 standard drinks    Comment: socially  . Drug use: No     Family Hx: The patient's family history includes Diabetes in her father; Hyperlipidemia in her father; Hypertension in her father; Stroke in her maternal grandmother; Thyroid disease in her mother.  ROS:   Please see the history of present illness.     All other systems reviewed and are negative.   Labs/Other Tests and Data Reviewed:    Recent Labs: 02/10/2018: Hemoglobin 14.0; Platelets 355 06/17/2018: BUN 9; Creatinine, Ser 0.57; Potassium 4.3; Sodium 140   Recent Lipid Panel Lab Results  Component Value Date/Time   CHOL 202 (H) 06/21/2016 09:10 AM   TRIG 119.0 06/21/2016 09:10 AM   HDL 46.50 06/21/2016 09:10 AM   CHOLHDL 4 06/21/2016 09:10 AM   LDLCALC 132 (H) 06/21/2016 09:10 AM    Wt Readings from Last 3 Encounters:  09/03/18 180 lb (81.6 kg)  06/17/18 185 lb (83.9 kg)  06/01/18  178 lb (80.7 kg)     Objective:    Vital Signs:  BP 98/64   Pulse 100   Ht  (1.6 m)   Wt 180 lb (81.6 kg)   BMI 31.89 kg/m    CONSTITUTIONAL:  Well nourished, well developed female in no acute distress.  EYES: anicteric MOUTH: oral mucosa is pink RESPIRATORY: Normal respiratory effort, symmetric expansion CARDIOVASCULAR: No peripheral edema SKIN: No rash, lesions or ulcers MUSCULOSKELETAL: no digital cyanosis NEURO: Cranial Nerves II-XII grossly intact, moves all extremities PSYCH: Intact judgement and insight.  A&O x 3, Mood/affect appropriate   ASSESSMENT & PLAN:  1.  OSA - the patient is tolerating PAP therapy well without any problems. The PAP download was reviewed today and showed an AHI of 0.6/hr on 13 cm H2O with 93% compliance in using more than 4 hours nightly.  The patient has been using and benefiting from PAP use and will continue to benefit from therapy. She did not like the nasal pillow mask because she is a mouth breather and the chin strap really irritated her.  She got a new full face mask today that she has already tried and says that it works much better.  I encouraged her to sleep on her side and to try to use her device all night every night to get the maximum benefit.  2.  Chronic systolic CHF - this is felt to be nonischemic in origin with coronary CTA showing no CAD.  Last EF assessment was 30% 03/2018 on echo.  She says that for a while she thought she was retaining more fluid but she increased her diuretics a bit and feels better.  She says that she was having some mild LE edema and SOB but that has improved.  She will continue on Entresto, bisoprolol, spiro and Lasix.   3.  Sinus tachycardia -  felt related to DCM and improved on medical therapy.  Her HR is right at 100bpm today.   4.  COVID-19 Education:The signs and symptoms of COVID-19 were discussed with the patient and how to seek care for testing (follow up with PCP or arrange E-visit).  The  importance of social distancing was discussed today.  Patient Risk:   After full review of this patient's clinical status, I feel that they are at least moderate risk at this time.  Time:   Today, I have spent 20 minutes directly with the patient on video discussing medical problems including OSA, CHF, sleep hygiene, healthy eating.  We also reviewed the symptoms of COVID 19 and the ways to protect against contracting the virus with telehealth technology.  I spent an additional 10 minutes reviewing patient's chart including sleep study and CPAP titration as well as PAP compliance download.  Medication Adjustments/Labs and Tests Ordered: Current medicines are reviewed at length with the patient today.  Concerns regarding medicines are outlined above.  Tests Ordered: No orders of the defined types were placed in this encounter.  Medication Changes: No orders of the defined types were placed in this encounter.   Disposition:  Follow up in 1 year(s)  Signed, Armanda Magic, MD  09/03/2018 3:43 PM    [facility]

## 2018-09-02 NOTE — Telephone Encounter (Signed)
Lydia Taylor has given consent to a phone visit with Dr. Mayford Knife. Lydia Taylor will have her BP, HR, and weight ready for her appt.   YOUR CARDIOLOGY TEAM HAS ARRANGED FOR AN E-VISIT FOR YOUR APPOINTMENT - PLEASE REVIEW IMPORTANT INFORMATION BELOW SEVERAL DAYS PRIOR TO YOUR APPOINTMENT  Due to the recent COVID-19 pandemic, we are transitioning in-person office visits to tele-medicine visits in an effort to decrease unnecessary exposure to our patients and staff. Medicare and most insurances are covering these visits without a copay needed. You will need a working email and a smartphone or computer with a camera and microphone. For patients that do not have these items, we can still complete the visit using a telephone but do prefer video when possible. If possible, we also ask that you have a blood pressure cuff and scale at home to measure your blood pressure, heart rate and weight prior to your scheduled appointment. Patients with clinical needs that need an in-person evaluation and testing will still be able to come to the office if absolutely necessary. If you have any questions, feel free to call our office.     DOWNLOADING THE SOFTWARE  Download the [employer] app to enable video and telephone visits with your [facility] Provider.   Instructions for downloading [employer] WebEx: - Go to https://www.webex.com/downloads.html and follow the instructions, or download the app on your smartphone [facility] [employer] Meetings). - If you have technical difficulties with downloading WebEx, please call WebEx at [phone]. - Once the app is downloaded (can be done on either mobile or desktop computer), go to Settings in the upper left hand corner.  Be sure that camera and audio are enabled.  - You will receive an email message with a link to the meeting with a time to join for your tele-health visit.  - Please download the app and have settings configured prior to the appointment time.      2-3 DAYS BEFORE YOUR  APPOINTMENT  One of our staff will call you to confirm that you have been able to set up your WebEx account. We will remind you check your blood pressure, heart rate and weight prior to your scheduled appointment. If you have an Apple Watch or Kardia, please upload any pertinent ECG strips the day before or morning of your appointment to MyChart. Our staff will also make sure you have reviewed the consent and agree to move forward with your scheduled tele-health visit.    THE DAY OF YOUR APPOINTMENT  Approximately 15-20 minutes prior to your scheduled appointment, you will receive an e-mail directly from one of our staff member's [email or web address] e-mail accounts inviting you to join a WebEx meeting.  Please do not reply to that email - simply join the [employer].  Upon joining, a member of the office staff will speak with you initially through the WebEx platform to confirm medications, vital signs for the day and any symptoms you may be experiencing.  Please have this information available prior to the time of visit start.      CONSENT FOR TELE-HEALTH VISIT - PLEASE RVIEW  I hereby voluntarily request, consent and authorize [facility] and its employed or contracted physicians, physician assistants, nurse practitioners or other licensed health care professionals (the Practitioner), to provide me with telemedicine health care services (the "Services") as deemed necessary by the treating Practitioner. I acknowledge and consent to receive the Services by the Practitioner via telemedicine. I understand that the telemedicine visit will involve communicating with the Practitioner  through live audiovisual communication technology and the disclosure of certain medical information by electronic transmission. I acknowledge that I have been given the opportunity to request an in-person assessment or other available alternative prior to the telemedicine visit and am voluntarily participating in the  telemedicine visit.  I understand that I have the right to withhold or withdraw my consent to the use of telemedicine in the course of my care at any time, without affecting my right to future care or treatment, and that the Practitioner or I may terminate the telemedicine visit at any time. I understand that I have the right to inspect all information obtained and/or recorded in the course of the telemedicine visit and may receive copies of available information for a reasonable fee.  I understand that some of the potential risks of receiving the Services via telemedicine include:  Marland Kitchen Delay or interruption in medical evaluation due to technological equipment failure or disruption; . Information transmitted may not be sufficient (e.g. poor resolution of images) to allow for appropriate medical decision making by the Practitioner; and/or  . In rare instances, security protocols could fail, causing a breach of personal health information.  Furthermore, I acknowledge that it is my responsibility to provide information about my medical history, conditions and care that is complete and accurate to the best of my ability. I acknowledge that Practitioner's advice, recommendations, and/or decision may be based on factors not within their control, such as incomplete or inaccurate data provided by me or distortions of diagnostic images or specimens that may result from electronic transmissions. I understand that the practice of medicine is not an exact science and that Practitioner makes no warranties or guarantees regarding treatment outcomes. I acknowledge that I will receive a copy of this consent concurrently upon execution via email to the email address I last provided but may also request a printed copy by calling the office of [place].    I understand that my insurance will be billed for this visit.   I have read or had this consent read to me. . I understand the contents of this consent, which  adequately explains the benefits and risks of the Services being provided via telemedicine.  . I have been provided ample opportunity to ask questions regarding this consent and the Services and have had my questions answered to my satisfaction. . I give my informed consent for the services to be provided through the use of telemedicine in my medical care  By participating in this telemedicine visit I agree to the above.

## 2018-09-03 ENCOUNTER — Other Ambulatory Visit: Payer: Self-pay

## 2018-09-03 ENCOUNTER — Encounter: Payer: Self-pay | Admitting: Cardiology

## 2018-09-03 ENCOUNTER — Telehealth (INDEPENDENT_AMBULATORY_CARE_PROVIDER_SITE_OTHER): Payer: Commercial Managed Care - PPO | Admitting: Cardiology

## 2018-09-03 VITALS — BP 98/64 | HR 100 | Ht 63.0 in | Wt 180.0 lb

## 2018-09-03 DIAGNOSIS — R000 Tachycardia, unspecified: Secondary | ICD-10-CM

## 2018-09-03 DIAGNOSIS — Z9989 Dependence on other enabling machines and devices: Secondary | ICD-10-CM

## 2018-09-03 DIAGNOSIS — Z7189 Other specified counseling: Secondary | ICD-10-CM

## 2018-09-03 DIAGNOSIS — I5022 Chronic systolic (congestive) heart failure: Secondary | ICD-10-CM | POA: Diagnosis not present

## 2018-09-03 DIAGNOSIS — G4733 Obstructive sleep apnea (adult) (pediatric): Secondary | ICD-10-CM

## 2018-09-03 NOTE — Telephone Encounter (Signed)
New message:   Patient missed a call from someone concering her appt today. Please call patient.

## 2018-09-03 NOTE — Telephone Encounter (Signed)
Error

## 2018-09-03 NOTE — Patient Instructions (Signed)

## 2018-09-04 ENCOUNTER — Ambulatory Visit (HOSPITAL_COMMUNITY): Payer: Commercial Managed Care - PPO

## 2018-09-07 ENCOUNTER — Ambulatory Visit (HOSPITAL_COMMUNITY): Payer: Commercial Managed Care - PPO

## 2018-09-09 ENCOUNTER — Ambulatory Visit (HOSPITAL_COMMUNITY): Payer: Commercial Managed Care - PPO

## 2018-09-11 ENCOUNTER — Ambulatory Visit (HOSPITAL_COMMUNITY): Payer: Commercial Managed Care - PPO

## 2018-09-14 ENCOUNTER — Ambulatory Visit (HOSPITAL_COMMUNITY): Payer: Commercial Managed Care - PPO

## 2018-09-15 ENCOUNTER — Ambulatory Visit (HOSPITAL_COMMUNITY): Payer: Commercial Managed Care - PPO

## 2018-09-16 ENCOUNTER — Ambulatory Visit (HOSPITAL_COMMUNITY): Payer: Commercial Managed Care - PPO

## 2018-09-17 ENCOUNTER — Encounter (HOSPITAL_COMMUNITY): Payer: Commercial Managed Care - PPO | Admitting: Cardiology

## 2018-09-18 ENCOUNTER — Ambulatory Visit (HOSPITAL_COMMUNITY): Payer: Commercial Managed Care - PPO

## 2018-09-21 ENCOUNTER — Ambulatory Visit (HOSPITAL_COMMUNITY): Payer: Commercial Managed Care - PPO

## 2018-09-23 ENCOUNTER — Ambulatory Visit (HOSPITAL_COMMUNITY): Payer: Commercial Managed Care - PPO

## 2018-09-25 ENCOUNTER — Ambulatory Visit (HOSPITAL_COMMUNITY): Payer: Commercial Managed Care - PPO

## 2018-09-28 ENCOUNTER — Ambulatory Visit (HOSPITAL_COMMUNITY): Payer: Commercial Managed Care - PPO

## 2018-09-28 ENCOUNTER — Telehealth (HOSPITAL_COMMUNITY): Payer: Self-pay | Admitting: *Deleted

## 2018-09-28 NOTE — Telephone Encounter (Signed)
Pt returned call.  Pt continues to struggle with emotional eating. Talked to pt extensively regarding identifying the catalyst behind the "happy food". Will send pt additional information regarding "healthy snacks" to keep on hand and mindful eating. Information on diabetes and making one change a week with her eating.  Pt has vowed not to purchase any "happy food" for a week.  Will keep pt on close follow up to assist with accountability. Alanson Aly, BSN Cardiac and Emergency planning/management officer

## 2018-09-30 ENCOUNTER — Ambulatory Visit (HOSPITAL_COMMUNITY): Payer: Commercial Managed Care - PPO

## 2018-10-02 ENCOUNTER — Ambulatory Visit (HOSPITAL_COMMUNITY): Payer: Commercial Managed Care - PPO

## 2018-10-05 ENCOUNTER — Other Ambulatory Visit (HOSPITAL_COMMUNITY): Payer: Self-pay | Admitting: Cardiology

## 2018-10-07 ENCOUNTER — Ambulatory Visit (HOSPITAL_COMMUNITY): Payer: Commercial Managed Care - PPO

## 2018-10-09 ENCOUNTER — Ambulatory Visit (HOSPITAL_COMMUNITY): Payer: Commercial Managed Care - PPO

## 2018-10-12 ENCOUNTER — Ambulatory Visit (HOSPITAL_COMMUNITY): Payer: Commercial Managed Care - PPO

## 2018-10-13 ENCOUNTER — Telehealth (HOSPITAL_COMMUNITY): Payer: Self-pay

## 2018-10-13 ENCOUNTER — Telehealth (HOSPITAL_COMMUNITY): Payer: Self-pay | Admitting: *Deleted

## 2018-10-13 ENCOUNTER — Encounter (HOSPITAL_COMMUNITY): Payer: Self-pay

## 2018-10-13 ENCOUNTER — Encounter (HOSPITAL_COMMUNITY): Payer: Commercial Managed Care - PPO

## 2018-10-13 NOTE — Telephone Encounter (Signed)
° °        Confirm Consent - In the setting of the current Covid19 crisis, you are scheduled for a phone visit with your Cardiac or Pulmonary team member.  Just as we do with many in-gym visits, in order for you to participate in this visit, we must obtain consent.  If you'd like, I can send this to your mychart (if signed up) or email for you to review.  Otherwise, I can obtain your verbal consent now.  By agreeing to a telephone visit, we'd like you to understand that the technology does not allow for your Cardiac or Pulmonary Rehab team member to perform a physical assessment, and thus may limit their ability to fully assess your ability to perform exercise programs. If your provider identifies any concerns that need to be evaluated in person, we will make arrangements to do so.  Finally, though the technology is pretty good, we cannot assure that it will always work on either your or our end and we cannot ensure that we have a secure connection.  Cardiac and Pulmonary Rehab Telehealth visits and At Home cardiac and pulmonary rehab are provided at no cost to you.               Are you willing to proceed?" STAFF: Did the patient verbally acknowledge consent to telehealth visit? Document YES/NO here: Yes      Jessica C.   Cardiac and Pulmonary Rehab Staff   10/13/2018 12:12PM

## 2018-10-14 ENCOUNTER — Ambulatory Visit (HOSPITAL_COMMUNITY): Payer: Commercial Managed Care - PPO

## 2018-10-14 ENCOUNTER — Telehealth (HOSPITAL_COMMUNITY): Payer: Self-pay

## 2018-10-14 ENCOUNTER — Encounter (HOSPITAL_COMMUNITY)
Admission: RE | Admit: 2018-10-14 | Discharge: 2018-10-14 | Disposition: A | Discharge status: Home / Self Care | Payer: Self-pay | Source: Ambulatory Visit | Attending: Cardiology | Admitting: Cardiology

## 2018-10-14 NOTE — Telephone Encounter (Signed)
Called and spoke to pt regarding Virtual Cardiac Rehab.  Pt  was able to download the [employer] app on their smart device with no issues. Pt set up their account and received the following welcome message -"Welcome to the [facility] program. We hope that you will find the exercise program beneficial in your recovery process. Our staff is available to assist with in questions/concerns about your exercise routine. Best wishes". Brief orientation provided to with the advisement to watch the "Intro to Rehab" series located under the Resource tab. Pt verbalized understanding. Will continue to follow and monitor pt progress with feedback as needed. 

## 2018-10-16 ENCOUNTER — Ambulatory Visit (HOSPITAL_COMMUNITY): Payer: Commercial Managed Care - PPO

## 2018-10-19 ENCOUNTER — Ambulatory Visit (HOSPITAL_COMMUNITY): Payer: Commercial Managed Care - PPO

## 2018-10-21 ENCOUNTER — Ambulatory Visit (HOSPITAL_COMMUNITY): Payer: Commercial Managed Care - PPO

## 2018-10-23 ENCOUNTER — Ambulatory Visit (HOSPITAL_COMMUNITY): Payer: Commercial Managed Care - PPO

## 2018-10-26 ENCOUNTER — Ambulatory Visit (HOSPITAL_COMMUNITY): Payer: Commercial Managed Care - PPO

## 2018-10-28 ENCOUNTER — Ambulatory Visit (HOSPITAL_COMMUNITY): Payer: Commercial Managed Care - PPO

## 2018-10-30 ENCOUNTER — Ambulatory Visit (HOSPITAL_COMMUNITY): Payer: Commercial Managed Care - PPO

## 2018-11-02 ENCOUNTER — Ambulatory Visit (HOSPITAL_COMMUNITY): Payer: Commercial Managed Care - PPO

## 2018-11-02 ENCOUNTER — Other Ambulatory Visit (HOSPITAL_COMMUNITY): Payer: Self-pay | Admitting: Cardiology

## 2018-11-03 ENCOUNTER — Telehealth (HOSPITAL_COMMUNITY): Payer: Self-pay | Admitting: *Deleted

## 2018-11-03 NOTE — Telephone Encounter (Signed)
LMTCB re: no exercise logged for week of 6/14-6/20 in Virtual Cardiac Rehab.

## 2018-11-04 ENCOUNTER — Ambulatory Visit (HOSPITAL_COMMUNITY): Payer: Commercial Managed Care - PPO

## 2018-11-06 ENCOUNTER — Ambulatory Visit (HOSPITAL_COMMUNITY): Payer: Commercial Managed Care - PPO

## 2018-11-09 ENCOUNTER — Ambulatory Visit (HOSPITAL_COMMUNITY): Payer: Commercial Managed Care - PPO

## 2018-11-12 ENCOUNTER — Telehealth (HOSPITAL_COMMUNITY): Payer: Self-pay | Admitting: *Deleted

## 2018-11-12 DIAGNOSIS — I5022 Chronic systolic (congestive) heart failure: Secondary | ICD-10-CM

## 2018-11-12 NOTE — Telephone Encounter (Signed)
-----   Message from Kara Mead sent at 11/12/2018  3:03 PM EDT ----- Regarding: Need Echo Order Spoke with her and have her scheduled 01/08/19 for Echo and f/u  ##I need an Order for the Echo too!  Thanks ----- Message ----- From: Harvie Junior, CMA Sent: 11/12/2018   2:37 PM EDT To: Mitzi Davenport Law Subject: echo and f/u next available                    Echo and f/u next available please and thank you

## 2018-11-25 ENCOUNTER — Telehealth (HOSPITAL_COMMUNITY): Payer: Self-pay

## 2018-11-26 ENCOUNTER — Telehealth (HOSPITAL_COMMUNITY): Payer: Self-pay

## 2018-11-26 NOTE — Telephone Encounter (Signed)
Pt insurance is active and benefits verified through [facility]. Co-pay $0.00, DED $2,500.00/$2,500.00 met, out of pocket $2,500.00/$2,500.00 met, co-insurance 20%. No pre-authorization required. Marie/[facility], 11/26/2018 @ 11:07AM JPV#[id number]

## 2018-11-26 NOTE — Telephone Encounter (Signed)
Copy and paste note from [place], [place].  Pt scheduled for cardiac rehab orientation 7/28 @ 1430 and will begin exercise 12/15/18 @ 1500. Insurance coverage reviewed. Pt instructed to wear tennis shoes for all visits.

## 2018-11-29 ENCOUNTER — Telehealth (HOSPITAL_COMMUNITY): Payer: Self-pay | Admitting: Pharmacist

## 2018-11-29 NOTE — Telephone Encounter (Signed)
Cardiac Rehab Medication Review by a Pharmacist  Does the patient  feel that his/her medications are working for him/her?  yes  Has the patient been experiencing any side effects to the medications prescribed?  Yes - Low blood pressure and losing hair.  Does the patient measure his/her own blood pressure or blood glucose at home?  yes - not consistently but will check if BP seems off. (~90/70 the last time she checked but it was a while back). Patient does not check blood sugars at home.  Does the patient have any problems obtaining medications due to transportation or finances?   no  Understanding of regimen: excellent Understanding of indications: excellent Potential of compliance: good   Lydia Taylor, PharmD PGY1 Acute Care Pharmacy Resident [phone] 11/29/2018 3:28 PM

## 2018-12-04 ENCOUNTER — Telehealth: Payer: Self-pay | Admitting: Cardiology

## 2018-12-04 DIAGNOSIS — G4733 Obstructive sleep apnea (adult) (pediatric): Secondary | ICD-10-CM

## 2018-12-04 DIAGNOSIS — Z9989 Dependence on other enabling machines and devices: Secondary | ICD-10-CM

## 2018-12-04 NOTE — Telephone Encounter (Signed)
New Message   Patient is calling in to see if she is able to get a new supplier for her CPAP equipment. Currently using [facility] and would like a new supplier. Please give patient a call back.

## 2018-12-07 ENCOUNTER — Encounter (HOSPITAL_COMMUNITY): Payer: Commercial Managed Care - PPO

## 2018-12-08 ENCOUNTER — Telehealth (HOSPITAL_COMMUNITY): Payer: Self-pay | Admitting: *Deleted

## 2018-12-08 ENCOUNTER — Encounter (HOSPITAL_COMMUNITY)
Admission: RE | Admit: 2018-12-08 | Discharge: 2018-12-08 | Disposition: A | Discharge status: Home / Self Care | Payer: Commercial Managed Care - PPO | Source: Ambulatory Visit | Attending: Cardiology | Admitting: Cardiology

## 2018-12-08 ENCOUNTER — Other Ambulatory Visit: Payer: Self-pay

## 2018-12-08 ENCOUNTER — Encounter (HOSPITAL_COMMUNITY): Payer: Self-pay

## 2018-12-08 VITALS — BP 90/62 | HR 90 | Temp 96.8°F | Ht 64.0 in | Wt 178.8 lb

## 2018-12-08 DIAGNOSIS — E119 Type 2 diabetes mellitus without complications: Secondary | ICD-10-CM | POA: Diagnosis not present

## 2018-12-08 DIAGNOSIS — F39 Unspecified mood [affective] disorder: Secondary | ICD-10-CM | POA: Insufficient documentation

## 2018-12-08 DIAGNOSIS — I5022 Chronic systolic (congestive) heart failure: Secondary | ICD-10-CM | POA: Insufficient documentation

## 2018-12-08 DIAGNOSIS — Z79890 Hormone replacement therapy (postmenopausal): Secondary | ICD-10-CM | POA: Diagnosis not present

## 2018-12-08 DIAGNOSIS — M797 Fibromyalgia: Secondary | ICD-10-CM | POA: Diagnosis not present

## 2018-12-08 DIAGNOSIS — Z79899 Other long term (current) drug therapy: Secondary | ICD-10-CM | POA: Diagnosis not present

## 2018-12-08 DIAGNOSIS — G4733 Obstructive sleep apnea (adult) (pediatric): Secondary | ICD-10-CM | POA: Insufficient documentation

## 2018-12-08 DIAGNOSIS — E282 Polycystic ovarian syndrome: Secondary | ICD-10-CM | POA: Diagnosis not present

## 2018-12-08 DIAGNOSIS — Z7984 Long term (current) use of oral hypoglycemic drugs: Secondary | ICD-10-CM | POA: Diagnosis not present

## 2018-12-08 HISTORY — DX: Heart failure, unspecified: I50.9

## 2018-12-08 LAB — GLUCOSE, CAPILLARY: Glucose-Capillary: 230 mg/dL — ABNORMAL HIGH (ref 70–99)

## 2018-12-08 NOTE — Progress Notes (Signed)
Cardiac Individual Treatment Plan  Patient Details  Name: Lydia FowlerJaime C Dilling MRN: [id number] Date of Birth: Oct 26, 1975 Referring Provider:     CARDIAC REHAB [facility]  Referring Provider  Dr. Shirlee LatchMcLean      Initial Encounter Date:    CARDIAC REHAB PHASE II ORIENTATION [facility]2018 [facility]  Date  12/08/18      Visit Diagnosis: Chronic Systolic Congestive Heart Failure  Patient's Home Medications on Admission:  Current Outpatient Medications:  .  bisoprolol (ZEBETA) 10 MG tablet, Take 1 tablet (10 mg total) by mouth daily., Disp: 30 tablet, Rfl: 11 .  clonazePAM (KLONOPIN) 0.5 MG tablet, Take 0.5 mg by mouth 2 (two) times daily as needed for anxiety., Disp: , Rfl:  .  empagliflozin (JARDIANCE) 25 MG TABS tablet, Take 25 mg by mouth daily., Disp: , Rfl:  .  ENTRESTO 49-51 MG, Take 1 tablet by mouth twice daily, Disp: 60 tablet, Rfl: 5 .  furosemide (LASIX) 20 MG tablet, Take 60 mg by mouth daily., Disp: , Rfl:  .  glimepiride (AMARYL) 1 MG tablet, TAKE 1 TABLET BY MOUTH ONCE DAILY WITH BREAKFAST, Disp: 90 tablet, Rfl: 0 .  lamoTRIgine (LAMICTAL) 25 MG tablet, Take 100 mg by mouth 2 (two) times daily. , Disp: , Rfl: 1 .  levothyroxine (SYNTHROID, LEVOTHROID) 25 MCG tablet, Take 25 mcg by mouth daily before breakfast., Disp: , Rfl:  .  metFORMIN (GLUCOPHAGE) 500 MG tablet, Take 500 mg by mouth 4 (four) times daily. , Disp: , Rfl:  .  Multiple Vitamins-Minerals (HAIR SKIN AND NAILS FORMULA PO), Take 1 tablet by mouth. Nature's Bounty Hair, Skin, and Nails Gummies, Disp: , Rfl:  .  norethindrone (MICRONOR,CAMILA,ERRIN) 0.35 MG tablet, Take 1 tablet by mouth daily., Disp: , Rfl:  .  pravastatin (PRAVACHOL) 40 MG tablet, TAKE 1 TABLET BY MOUTH ONCE DAILY, Disp: 30 tablet, Rfl: 3 .  Semaglutide (OZEMPIC) 0.25 or 0.5 MG/DOSE SOPN, Inject 0.5 mg into the skin once a week. , Disp: , Rfl:   .  spironolactone (ALDACTONE) 25 MG tablet, TAKE 1 TABLET BY MOUTH  DAILY, Disp: 90 tablet, Rfl: 3 .  vitamin B-12 (CYANOCOBALAMIN) 1000 MCG tablet, Take 2,500 mcg by mouth daily., Disp: , Rfl:   Past Medical History: Past Medical History:  Diagnosis Date  . CHF (congestive heart failure) (HCC)   . Chicken pox   . Diabetes mellitus without complication (HCC)   . DM (diabetes mellitus), type 2 (HCC)   . Fibromyalgia   . Mood disorder (HCC)   . OSA on CPAP 09/02/2018    mild OSA with an AHI of 6.4/hr and O2 desaturations as low as 85% and underwent CPAP titration to 13cm H2O.  Marland Kitchen. PCOS (polycystic ovarian syndrome)   . Sinus tachycardia     Tobacco Use: Social History   Tobacco Use  Smoking Status Never Smoker  Smokeless Tobacco Never Used    Labs: Recent Review Flowsheet Data    Labs for ITP Cardiac and Pulmonary Rehab Latest Ref Rng & Units 12/23/2011 05/27/2016 06/21/2016   Cholestrol 0 - 200 mg/dL - - 782(N202(H)   LDLCALC 0 - 99 mg/dL - - 562(Z132(H)   HDL >30.86>39.00 mg/dL - - 57.8446.50   Trlycerides 0.0 - 149.0 mg/dL - - 696.2119.0   Hemoglobin A1c - 10.0(H) 8.0 -      Capillary Blood Glucose: Lab Results  Component Value Date  GLUCAP 230 (H) 12/08/2018     Exercise Target Goals: Exercise Program Goal: Individual exercise prescription set using results from initial 6 min walk test and THRR while considering  patient's activity barriers and safety.   Exercise Prescription Goal: Starting with aerobic activity 30 plus minutes a day, 3 days per week for initial exercise prescription. Provide home exercise prescription and guidelines that participant acknowledges understanding prior to discharge.  Activity Barriers & Risk Stratification: Activity Barriers & Cardiac Risk Stratification - 12/08/18 1616      Activity Barriers & Cardiac Risk Stratification   Activity Barriers  Fibromyalgia;Deconditioning;Muscular Weakness    Cardiac Risk Stratification  High       6 Minute Walk: 6 Minute  Walk    Row Name 12/08/18 1615         6 Minute Walk   Phase  Initial     Distance  1227 feet     Walk Time  6 minutes     # of Rest Breaks  0     MPH  2.3     METS  3.7     RPE  11     Perceived Dyspnea   0     VO2 Peak  13.11     Symptoms  No     Resting HR  90 bpm     Resting BP  90/62     Resting Oxygen Saturation   99 %     Exercise Oxygen Saturation  during 6 min walk  99 %     Max Ex. HR  102 bpm     Max Ex. BP  85/61 Gatorade given. Asymptomatic     2 Minute Post BP  103/73        Oxygen Initial Assessment:   Oxygen Re-Evaluation:   Oxygen Discharge (Final Oxygen Re-Evaluation):   Initial Exercise Prescription: Initial Exercise Prescription - 12/08/18 1600      Date of Initial Exercise RX and Referring Provider   Date  12/08/18    Referring Provider  Dr. Shirlee Latch    Expected Discharge Date  01/22/19      Treadmill   MPH  2    Grade  0    Minutes  15      NuStep   Level  2    SPM  75    Minutes  15    METs  3      Prescription Details   Frequency (times per week)  3    Duration  Progress to 30 minutes of continuous aerobic without signs/symptoms of physical distress      Intensity   THRR 40-80% of Max Heartrate  71-142    Ratings of Perceived Exertion  11-13      Progression   Progression  Continue to progress workloads to maintain intensity without signs/symptoms of physical distress.      Resistance Training   Training Prescription  Yes    Weight  3 lbs.     Reps  10-15       Perform Capillary Blood Glucose checks as needed.  Exercise Prescription Changes:   Exercise Comments:   Exercise Goals and Review:  Exercise Goals    Row Name 12/08/18 1618             Exercise Goals   Increase Physical Activity  Yes       Intervention  Provide advice, education, support and counseling about physical activity/exercise needs.;Develop an individualized exercise prescription for aerobic  and resistive training based on initial  evaluation findings, risk stratification, comorbidities and participant's personal goals.       Expected Outcomes  Short Term: Attend rehab on a regular basis to increase amount of physical activity.;Long Term: Add in home exercise to make exercise part of routine and to increase amount of physical activity.;Long Term: Exercising regularly at least 3-5 days a week.       Increase Strength and Stamina  Yes       Intervention  Provide advice, education, support and counseling about physical activity/exercise needs.;Develop an individualized exercise prescription for aerobic and resistive training based on initial evaluation findings, risk stratification, comorbidities and participant's personal goals.       Expected Outcomes  Short Term: Increase workloads from initial exercise prescription for resistance, speed, and METs.;Short Term: Perform resistance training exercises routinely during rehab and add in resistance training at home;Long Term: Improve cardiorespiratory fitness, muscular endurance and strength as measured by increased METs and functional capacity (6MWT)       Able to understand and use rate of perceived exertion (RPE) scale  Yes       Intervention  Provide education and explanation on how to use RPE scale       Expected Outcomes  Short Term: Able to use RPE daily in rehab to express subjective intensity level;Long Term:  Able to use RPE to guide intensity level when exercising independently       Knowledge and understanding of Target Heart Rate Range (THRR)  Yes       Intervention  Provide education and explanation of THRR including how the numbers were predicted and where they are located for reference       Expected Outcomes  Short Term: Able to state/look up THRR;Long Term: Able to use THRR to govern intensity when exercising independently;Short Term: Able to use daily as guideline for intensity in rehab       Able to check pulse independently  Yes       Intervention  Provide education  and demonstration on how to check pulse in carotid and radial arteries.;Review the importance of being able to check your own pulse for safety during independent exercise       Expected Outcomes  Short Term: Able to explain why pulse checking is important during independent exercise;Long Term: Able to check pulse independently and accurately       Understanding of Exercise Prescription  Yes       Intervention  Provide education, explanation, and written materials on patient's individual exercise prescription       Expected Outcomes  Short Term: Able to explain program exercise prescription;Long Term: Able to explain home exercise prescription to exercise independently          Exercise Goals Re-Evaluation :    Discharge Exercise Prescription (Final Exercise Prescription Changes):   Nutrition:  Target Goals: Understanding of nutrition guidelines, daily intake of sodium 1500mg , cholesterol 200mg , calories 30% from fat and 7% or less from saturated fats, daily to have 5 or more servings of fruits and vegetables.  Biometrics: Pre Biometrics - 12/08/18 1619      Pre Biometrics   Height  5\' 4"  (1.626 m)    Weight  81.1 kg    Waist Circumference  41.5 inches    Hip Circumference  42.5 inches    Waist to Hip Ratio  0.98 %    BMI (Calculated)  30.67    Triceps Skinfold  26 mm    %  Body Fat  40.6 %    Grip Strength  33 kg    Flexibility  13 in    Single Leg Stand  20.75 seconds        Nutrition Therapy Plan and Nutrition Goals:   Nutrition Assessments:   Nutrition Goals Re-Evaluation:   Nutrition Goals Discharge (Final Nutrition Goals Re-Evaluation):   Psychosocial: Target Goals: Acknowledge presence or absence of significant depression and/or stress, maximize coping skills, provide positive support system. Participant is able to verbalize types and ability to use techniques and skills needed for reducing stress and depression.  Initial Review & Psychosocial  Screening: Initial Psych Review & Screening - 12/08/18 1608      Initial Review   Current issues with  Current Anxiety/Panic      Family Dynamics   Good Support System?  Yes   Marijean NiemannJaime has her husband for support     Barriers   Psychosocial barriers to participate in program  The patient should benefit from training in stress management and relaxation.      Screening Interventions   Interventions  Encouraged to exercise       Quality of Life Scores: Quality of Life - 12/08/18 1625      Quality of Life   Select  Quality of Life      Quality of Life Scores   Health/Function Pre  16.1 %    Socioeconomic Pre  24.56 %    Psych/Spiritual Pre  23.64 %    Family Pre  26.4 %    GLOBAL Pre  21.01 %      Scores of 19 and below usually indicate a poorer quality of life in these areas.  A difference of  2-3 points is a clinically meaningful difference.  A difference of 2-3 points in the total score of the Quality of Life Index has been associated with significant improvement in overall quality of life, self-image, physical symptoms, and general health in studies assessing change in quality of life.  PHQ-9: Recent Review Flowsheet Data    Depression screen [facility] 12/08/2018 06/13/2016   Decreased Interest 0 0   Down, Depressed, Hopeless 0 1   PHQ - 2 Score 0 1     Interpretation of Total Score  Total Score Depression Severity:  1-4 = Minimal depression, 5-9 = Mild depression, 10-14 = Moderate depression, 15-19 = Moderately severe depression, 20-27 = Severe depression   Psychosocial Evaluation and Intervention:   Psychosocial Re-Evaluation:   Psychosocial Discharge (Final Psychosocial Re-Evaluation):   Vocational Rehabilitation: Provide vocational rehab assistance to qualifying candidates.   Vocational Rehab Evaluation & Intervention: Vocational Rehab - 12/08/18 1612      Initial Vocational Rehab Evaluation & Intervention   Assessment shows need for Vocational Rehabilitation   No       Education: Education Goals: Education classes will be provided on a weekly basis, covering required topics. Participant will state understanding/return demonstration of topics presented.  Learning Barriers/Preferences: Learning Barriers/Preferences - 12/08/18 1626      Learning Barriers/Preferences   Learning Barriers  Sight    Learning Preferences  Individual Instruction;Skilled Demonstration       Education Topics: Hypertension, Hypertension Reduction -Define heart disease and high blood pressure. Discus how high blood pressure affects the body and ways to reduce high blood pressure.   Exercise and Your Heart -Discuss why it is important to exercise, the FITT principles of exercise, normal and abnormal responses to exercise, and how to exercise safely.   Angina -  Discuss definition of angina, causes of angina, treatment of angina, and how to decrease risk of having angina.   Cardiac Medications -Review what the following cardiac medications are used for, how they affect the body, and side effects that may occur when taking the medications.  Medications include Aspirin, Beta blockers, calcium channel blockers, ACE Inhibitors, angiotensin receptor blockers, diuretics, digoxin, and antihyperlipidemics.   Congestive Heart Failure -Discuss the definition of CHF, how to live with CHF, the signs and symptoms of CHF, and how keep track of weight and sodium intake.   Heart Disease and Intimacy -Discus the effect sexual activity has on the heart, how changes occur during intimacy as we age, and safety during sexual activity.   Smoking Cessation / COPD -Discuss different methods to quit smoking, the health benefits of quitting smoking, and the definition of COPD.   Nutrition I: Fats -Discuss the types of cholesterol, what cholesterol does to the heart, and how cholesterol levels can be controlled.   Nutrition II: Labels -Discuss the different components of food  labels and how to read food label   Heart Parts/Heart Disease and PAD -Discuss the anatomy of the heart, the pathway of blood circulation through the heart, and these are affected by heart disease.   Stress I: Signs and Symptoms -Discuss the causes of stress, how stress may lead to anxiety and depression, and ways to limit stress.   Stress II: Relaxation -Discuss different types of relaxation techniques to limit stress.   Warning Signs of Stroke / TIA -Discuss definition of a stroke, what the signs and symptoms are of a stroke, and how to identify when someone is having stroke.   Knowledge Questionnaire Score: Knowledge Questionnaire Score - 12/08/18 1621      Knowledge Questionnaire Score   Pre Score  20/24       Core Components/Risk Factors/Patient Goals at Admission: Personal Goals and Risk Factors at Admission - 12/08/18 1621      Core Components/Risk Factors/Patient Goals on Admission    Weight Management  Yes;Obesity;Weight Maintenance;Weight Loss    Admit Weight  178 lb 12.7 oz (81.1 kg)    Diabetes  Yes    Intervention  Provide education about signs/symptoms and action to take for hypo/hyperglycemia.;Provide education about proper nutrition, including hydration, and aerobic/resistive exercise prescription along with prescribed medications to achieve blood glucose in normal ranges: Fasting glucose 65-99 mg/dL    Expected Outcomes  Short Term: Participant verbalizes understanding of the signs/symptoms and immediate care of hyper/hypoglycemia, proper foot care and importance of medication, aerobic/resistive exercise and nutrition plan for blood glucose control.;Long Term: Attainment of HbA1C < 7%.    Heart Failure  Yes    Expected Outcomes  Improve functional capacity of life;Short term: Attendance in program 2-3 days a week with increased exercise capacity. Reported lower sodium intake. Reported increased fruit and vegetable intake. Reports medication compliance.;Short  term: Daily weights obtained and reported for increase. Utilizing diuretic protocols set by physician.;Long term: Adoption of self-care skills and reduction of barriers for early signs and symptoms recognition and intervention leading to self-care maintenance.    Hypertension  Yes    Intervention  Provide education on lifestyle modifcations including regular physical activity/exercise, weight management, moderate sodium restriction and increased consumption of fresh fruit, vegetables, and low fat dairy, alcohol moderation, and smoking cessation.;Monitor prescription use compliance.    Expected Outcomes  Short Term: Continued assessment and intervention until BP is < 140/17mm HG in hypertensive participants. < 130/73mm HG in hypertensive  participants with diabetes, heart failure or chronic kidney disease.;Long Term: Maintenance of blood pressure at goal levels.    Lipids  Yes    Intervention  Provide education and support for participant on nutrition & aerobic/resistive exercise along with prescribed medications to achieve LDL 70mg , HDL >40mg .    Expected Outcomes  Short Term: Participant states understanding of desired cholesterol values and is compliant with medications prescribed. Participant is following exercise prescription and nutrition guidelines.;Long Term: Cholesterol controlled with medications as prescribed, with individualized exercise RX and with personalized nutrition plan. Value goals: LDL < 70mg , HDL > 40 mg.    Stress  Yes    Intervention  Offer individual and/or small group education and counseling on adjustment to heart disease, stress management and health-related lifestyle change. Teach and support self-help strategies.;Refer participants experiencing significant psychosocial distress to appropriate mental health specialists for further evaluation and treatment. When possible, include family members and significant others in education/counseling sessions.    Expected Outcomes  Short  Term: Participant demonstrates changes in health-related behavior, relaxation and other stress management skills, ability to obtain effective social support, and compliance with psychotropic medications if prescribed.;Long Term: Emotional wellbeing is indicated by absence of clinically significant psychosocial distress or social isolation.       Core Components/Risk Factors/Patient Goals Review:    Core Components/Risk Factors/Patient Goals at Discharge (Final Review):    ITP Comments: ITP Comments    Row Name 12/08/18 1607           ITP Comments  Dr Fransico Him MD, Medical Director          Comments: Patient attended orientation on 12/08/2018 to review rules and guidelines for program.  Completed 6 minute walk test, Intitial ITP, and exercise prescription. Please see previous note regarding low BP Telemetry-Sinus Rhythm.  Asymptomatic. Safety measures and social distancing in place per CDC guidelines.Barnet Pall, RN,BSN 12/08/2018 4:51 PM

## 2018-12-08 NOTE — Telephone Encounter (Signed)
Lydia Taylor, RNcalled to report pt bp at rest sitting was 90/62 standing 87/67 patient had some water and bp sitting was 90/64 standing 89/66. Pt is asymptomatic.  Lydia Taylor requested bp parameters per Dr.McLean systolic bp should be 85 or greater and diastolic bp should be 55 or greater. Lydia Taylor aware.

## 2018-12-08 NOTE — Progress Notes (Signed)
Patient reported to cardiac rehab with an entry blood pressure of 90/62 sitting. Standing blood pressure 87/67. Heart rate 93. Patient asymptomatic and reports that she gets low BP readings at home. Recheck blood pressure 90/64 sitting, 89/66 standing after drinking 10 ounces of water. Dr Claris Gladden office called and notified spoke with [facility]. Dr Aundra Dubin said that Lydia Taylor can exercise for systolic blood pressure of 85 or greater or diastolic blood pressure equal or greater than 55.Lydia Pall, RN,BSN 12/08/2018 4:42 PM

## 2018-12-09 ENCOUNTER — Encounter (HOSPITAL_COMMUNITY): Payer: Commercial Managed Care - PPO

## 2018-12-11 ENCOUNTER — Encounter (HOSPITAL_COMMUNITY): Payer: Commercial Managed Care - PPO

## 2018-12-14 ENCOUNTER — Encounter (HOSPITAL_COMMUNITY): Payer: Commercial Managed Care - PPO

## 2018-12-14 ENCOUNTER — Telehealth (HOSPITAL_COMMUNITY): Payer: Self-pay | Admitting: Family Medicine

## 2018-12-15 NOTE — Telephone Encounter (Signed)
See note below

## 2018-12-15 NOTE — Telephone Encounter (Signed)
Return call: Called patient LMTCB on cell.

## 2018-12-15 NOTE — Telephone Encounter (Signed)
Patient wants to try a P30 I  s/m size nasal pillow mask she saw on [employer] website.  The one she has is a small N30 I and this is not comfortable for her. This mask leaks unless she sleeps on her back. She states she is a side sleeper.  Please advise

## 2018-12-15 NOTE — Telephone Encounter (Signed)
Order placed to [facility] to order an [employer] P30 I s/m nasal pillow mask with chin strap and get a download in 2 weeks

## 2018-12-15 NOTE — Telephone Encounter (Signed)
Return call: Patient called back to say she wants to try a nasal pillow mask. She has a N30I nasal

## 2018-12-15 NOTE — Telephone Encounter (Signed)
Please order an Airfit P30 mask with chin strap and get a download in 2 weeks

## 2018-12-16 ENCOUNTER — Encounter (HOSPITAL_COMMUNITY): Payer: Commercial Managed Care - PPO

## 2018-12-18 ENCOUNTER — Encounter (HOSPITAL_COMMUNITY)
Admission: RE | Admit: 2018-12-18 | Discharge: 2018-12-18 | Disposition: A | Discharge status: Home / Self Care | Payer: Commercial Managed Care - PPO | Source: Ambulatory Visit | Attending: Cardiology | Admitting: Cardiology

## 2018-12-18 ENCOUNTER — Other Ambulatory Visit: Payer: Self-pay

## 2018-12-18 DIAGNOSIS — G4733 Obstructive sleep apnea (adult) (pediatric): Secondary | ICD-10-CM | POA: Insufficient documentation

## 2018-12-18 DIAGNOSIS — E282 Polycystic ovarian syndrome: Secondary | ICD-10-CM | POA: Diagnosis not present

## 2018-12-18 DIAGNOSIS — E119 Type 2 diabetes mellitus without complications: Secondary | ICD-10-CM | POA: Diagnosis not present

## 2018-12-18 DIAGNOSIS — Z7984 Long term (current) use of oral hypoglycemic drugs: Secondary | ICD-10-CM | POA: Diagnosis not present

## 2018-12-18 DIAGNOSIS — F39 Unspecified mood [affective] disorder: Secondary | ICD-10-CM | POA: Insufficient documentation

## 2018-12-18 DIAGNOSIS — M797 Fibromyalgia: Secondary | ICD-10-CM | POA: Insufficient documentation

## 2018-12-18 DIAGNOSIS — Z79890 Hormone replacement therapy (postmenopausal): Secondary | ICD-10-CM | POA: Diagnosis not present

## 2018-12-18 DIAGNOSIS — Z79899 Other long term (current) drug therapy: Secondary | ICD-10-CM | POA: Diagnosis not present

## 2018-12-18 DIAGNOSIS — I5022 Chronic systolic (congestive) heart failure: Secondary | ICD-10-CM | POA: Diagnosis not present

## 2018-12-18 LAB — GLUCOSE, CAPILLARY
Glucose-Capillary: 132 mg/dL — ABNORMAL HIGH (ref 70–99)
Glucose-Capillary: 117 mg/dL — ABNORMAL HIGH (ref 70–99)

## 2018-12-18 NOTE — Progress Notes (Signed)
Daily Session Note  Patient Details  Name: Lydia Taylor MRN: [id number] Date of Birth: 1975/11/13 Referring Provider:     CARDIAC REHAB PHASE II ORIENTATION from 12/08/2018 in [place]  Referring Provider  Dr. Aundra Dubin      Encounter Date: 12/18/2018  Check In: Session Check In - 12/18/18 1521      Check-In   Supervising physician immediately available to respond to emergencies  [facility] immediately available    Physician(s)  Dr. Benny Lennert    Location  [facility]    Staff Present  Jiles Garter, RN, Deland Pretty, MS, ACSM CEP, Exercise Physiologist;Maria Whitaker, RN, BSN    Virtual Visit  No    Medication changes reported      No    Fall or balance concerns reported     No    Tobacco Cessation  No Change    Warm-up and Cool-down  Performed on first and last piece of equipment    Resistance Training Performed  Yes    VAD Patient?  No    PAD/SET Patient?  No      Pain Assessment   Currently in Pain?  No/denies    Multiple Pain Sites  No       Capillary Blood Glucose: Results for orders placed or performed during the hospital encounter of 12/18/18 (from the past 24 hour(s))  Glucose, capillary     Status: Abnormal   Collection Time: 12/18/18  3:07 PM  Result Value Ref Range   Glucose-Capillary 132 (H) 70 - 99 mg/dL  Glucose, capillary     Status: Abnormal   Collection Time: 12/18/18  3:56 PM  Result Value Ref Range   Glucose-Capillary 117 (H) 70 - 99 mg/dL    Exercise Prescription Changes - 12/18/18 1600      Response to Exercise   Blood Pressure (Admit)  92/70    Blood Pressure (Exercise)  107/73    Blood Pressure (Exit)  108/74    Heart Rate (Admit)  99 bpm    Heart Rate (Exercise)  109 bpm    Heart Rate (Exit)  97 bpm    Rating of Perceived Exertion (Exercise)  11    Symptoms  none    Comments  Pt tolerated 1st session of exercise fairly well at low intensity exercise.    Duration  Progress to  30 minutes of  aerobic without signs/symptoms of physical distress    Intensity  THRR unchanged      Progression   Progression  Continue to progress workloads to maintain intensity without signs/symptoms of physical distress.    Average METs  1.7      Resistance Training   Training Prescription  Yes    Weight  3 lbs.     Reps  10-15    Time  10 Minutes      Interval Training   Interval Training  No      Treadmill   MPH  --    Grade  --    Minutes  --      Recumbant Bike   Level  1    Minutes  8   Stopped early: to restroom.   METs  1.6      NuStep   Level  2    SPM  75    Minutes  15    METs  1.7       Social History   Tobacco Use  Smoking Status  Never Smoker  Smokeless Tobacco Never Used   Goals Met:  No report of cardiac concerns or symptoms  Goals Unmet:  Not Applicable elemetry-Sinus Rhythm, asymptomatic.  Medication list reconciled. Pt denies barriers to medicaiton compliance.  PS Comments: Pt started cardiac rehab today.  Pt tolerated light exercise without difficulty. VSS, tYCHOSOCIAL ASSESSMENT:  PHQ-0. Pt exhibits positive coping skills, hopeful outlook with supportive family. No psychosocial needs identified at this time, no psychosocial interventions necessary.    Pt enjoys watching TV spending time with her family and shopping. .Pt oriented to exercise equipment and routine.    Understanding verbalized. Barnet Pall, RN,BSN 12/18/2018 4:47 PM Dr. Fransico Him is Medical Director for Cardiac Rehab at [facility].

## 2018-12-21 ENCOUNTER — Other Ambulatory Visit: Payer: Self-pay

## 2018-12-21 ENCOUNTER — Encounter (HOSPITAL_COMMUNITY)
Admission: RE | Admit: 2018-12-21 | Discharge: 2018-12-21 | Disposition: A | Discharge status: Home / Self Care | Payer: Commercial Managed Care - PPO | Source: Ambulatory Visit | Attending: Cardiology | Admitting: Cardiology

## 2018-12-21 DIAGNOSIS — I5022 Chronic systolic (congestive) heart failure: Secondary | ICD-10-CM

## 2018-12-21 LAB — GLUCOSE, CAPILLARY
Glucose-Capillary: 183 mg/dL — ABNORMAL HIGH (ref 70–99)
Glucose-Capillary: 152 mg/dL — ABNORMAL HIGH (ref 70–99)

## 2018-12-23 ENCOUNTER — Encounter (HOSPITAL_COMMUNITY): Payer: Commercial Managed Care - PPO

## 2018-12-25 ENCOUNTER — Encounter (HOSPITAL_COMMUNITY)
Admission: RE | Admit: 2018-12-25 | Discharge: 2018-12-25 | Disposition: A | Discharge status: Home / Self Care | Payer: Commercial Managed Care - PPO | Source: Ambulatory Visit | Attending: Cardiology | Admitting: Cardiology

## 2018-12-25 ENCOUNTER — Other Ambulatory Visit: Payer: Self-pay

## 2018-12-25 DIAGNOSIS — I5022 Chronic systolic (congestive) heart failure: Secondary | ICD-10-CM

## 2018-12-28 ENCOUNTER — Other Ambulatory Visit: Payer: Self-pay

## 2018-12-28 ENCOUNTER — Encounter (HOSPITAL_COMMUNITY)
Admission: RE | Admit: 2018-12-28 | Discharge: 2018-12-28 | Disposition: A | Discharge status: Home / Self Care | Payer: Commercial Managed Care - PPO | Source: Ambulatory Visit | Attending: Cardiology | Admitting: Cardiology

## 2018-12-28 DIAGNOSIS — I5022 Chronic systolic (congestive) heart failure: Secondary | ICD-10-CM | POA: Diagnosis not present

## 2018-12-30 ENCOUNTER — Encounter (HOSPITAL_COMMUNITY): Payer: Commercial Managed Care - PPO

## 2018-12-31 NOTE — Progress Notes (Signed)
Cardiac Individual Treatment Plan  Patient Details  Name: Lydia Taylor MRN: [id number] Date of Birth: [birth year] Referring Provider:     CARDIAC REHAB PHASE II ORIENTATION from 12/08/2018 in [facility] CARDI[facility]  Referring Provider  Dr. Shirlee Latch      Initial Encounter Date:    CARDIAC REHAB PHASE II ORIENTATION from 12/08/2018 in [facility] CARDIAC REHAB  Date  12/08/18      Visit Diagnosis: Heart failure, chronic systolic (HCC)  Patient's Home Medications on Admission:  Current Outpatient Medications:  .  bisoprolol (ZEBETA) 10 MG tablet, Take 1 tablet (10 mg total) by mouth daily., Disp: 30 tablet, Rfl: 11 .  clonazePAM (KLONOPIN) 0.5 MG tablet, Take 0.5 mg by mouth 2 (two) times daily as needed for anxiety., Disp: , Rfl:  .  empagliflozin (JARDIANCE) 25 MG TABS tablet, Take 25 mg by mouth daily., Disp: , Rfl:  .  ENTRESTO 49-51 MG, Take 1 tablet by mouth twice daily, Disp: 60 tablet, Rfl: 5 .  furosemide (LASIX) 20 MG tablet, Take 60 mg by mouth daily., Disp: , Rfl:  .  glimepiride (AMARYL) 1 MG tablet, TAKE 1 TABLET BY MOUTH ONCE DAILY WITH BREAKFAST, Disp: 90 tablet, Rfl: 0 .  lamoTRIgine (LAMICTAL) 25 MG tablet, Take 100 mg by mouth 2 (two) times daily. , Disp: , Rfl: 1 .  levothyroxine (SYNTHROID, LEVOTHROID) 25 MCG tablet, Take 25 mcg by mouth daily before breakfast., Disp: , Rfl:  .  metFORMIN (GLUCOPHAGE) 500 MG tablet, Take 500 mg by mouth 4 (four) times daily. , Disp: , Rfl:  .  Multiple Vitamins-Minerals (HAIR SKIN AND NAILS FORMULA PO), Take 1 tablet by mouth. Nature's Bounty Hair, Skin, and Nails Gummies, Disp: , Rfl:  .  norethindrone (MICRONOR,CAMILA,ERRIN) 0.35 MG tablet, Take 1 tablet by mouth daily., Disp: , Rfl:  .  pravastatin (PRAVACHOL) 40 MG tablet, TAKE 1 TABLET BY MOUTH ONCE DAILY, Disp: 30 tablet, Rfl: 3 .  Semaglutide (OZEMPIC) 0.25 or 0.5 MG/DOSE SOPN, Inject 0.5 mg into the skin once a week. , Disp: , Rfl:  .   spironolactone (ALDACTONE) 25 MG tablet, TAKE 1 TABLET BY MOUTH  DAILY, Disp: 90 tablet, Rfl: 3 .  vitamin B-12 (CYANOCOBALAMIN) 1000 MCG tablet, Take 2,500 mcg by mouth daily., Disp: , Rfl:   Past Medical History: Past Medical History:  Diagnosis Date  . CHF (congestive heart failure) (HCC)   . Chicken pox   . Diabetes mellitus without complication (HCC)   . DM (diabetes mellitus), type 2 (HCC)   . Fibromyalgia   . Mood disorder (HCC)   . OSA on CPAP 09/02/2018    mild OSA with an AHI of 6.4/hr and O2 desaturations as low as 85% and underwent CPAP titration to 13cm H2O.  Marland Kitchen PCOS (polycystic ovarian syndrome)   . Sinus tachycardia     Tobacco Use: Social History   Tobacco Use  Smoking Status Never Smoker  Smokeless Tobacco Never Used    Labs: Recent Review Flowsheet Data    Labs for ITP Cardiac and Pulmonary Rehab Latest Ref Rng & Units 12/23/2011 05/27/2016 06/21/2016   Cholestrol 0 - 200 mg/dL - - 657(Q)   LDLCALC 0 - 99 mg/dL - - 469(G)   HDL >29.52 mg/dL - - 84.13   Trlycerides 0.0 - 149.0 mg/dL - - 244.0   Hemoglobin A1c - 10.0(H) 8.0 -      Capillary Blood Glucose: Lab Results  Component Value Date  GLUCAP 152 (H) 12/21/2018   GLUCAP 183 (H) 12/21/2018   GLUCAP 117 (H) 12/18/2018   GLUCAP 132 (H) 12/18/2018   GLUCAP 230 (H) 12/08/2018     Exercise Target Goals: Exercise Program Goal: Individual exercise prescription set using results from initial 6 min walk test and THRR while considering  patient's activity barriers and safety.   Exercise Prescription Goal: Initial exercise prescription builds to 30-45 minutes a day of aerobic activity, 2-3 days per week.  Home exercise guidelines will be given to patient during program as part of exercise prescription that the participant will acknowledge.  Activity Barriers & Risk Stratification: Activity Barriers & Cardiac Risk Stratification - 12/08/18 1616      Activity Barriers & Cardiac Risk Stratification    Activity Barriers  Fibromyalgia;Deconditioning;Muscular Weakness    Cardiac Risk Stratification  High       6 Minute Walk: 6 Minute Walk    Row Name 12/08/18 1615         6 Minute Walk   Phase  Initial     Distance  1227 feet     Walk Time  6 minutes     # of Rest Breaks  0     MPH  2.3     METS  3.7     RPE  11     Perceived Dyspnea   0     VO2 Peak  13.11     Symptoms  No     Resting HR  90 bpm     Resting BP  90/62     Resting Oxygen Saturation   99 %     Exercise Oxygen Saturation  during 6 min walk  99 %     Max Ex. HR  102 bpm     Max Ex. BP  85/61 Gatorade given. Asymptomatic     2 Minute Post BP  103/73        Oxygen Initial Assessment:   Oxygen Re-Evaluation:   Oxygen Discharge (Final Oxygen Re-Evaluation):   Initial Exercise Prescription: Initial Exercise Prescription - 12/08/18 1600      Date of Initial Exercise RX and Referring Provider   Date  12/08/18    Referring Provider  Dr. Shirlee LatchMcLean    Expected Discharge Date  01/22/19      Treadmill   MPH  2    Grade  0    Minutes  15      NuStep   Level  2    SPM  75    Minutes  15    METs  3      Prescription Details   Frequency (times per week)  3    Duration  Progress to 30 minutes of continuous aerobic without signs/symptoms of physical distress      Intensity   THRR 40-80% of Max Heartrate  71-142    Ratings of Perceived Exertion  11-13      Progression   Progression  Continue to progress workloads to maintain intensity without signs/symptoms of physical distress.      Resistance Training   Training Prescription  Yes    Weight  3 lbs.     Reps  10-15       Perform Capillary Blood Glucose checks as needed.  Exercise Prescription Changes: Exercise Prescription Changes    Row Name 12/18/18 1600 12/30/18 1500           Response to Exercise   Blood Pressure (Admit)  92/70  94/62  Blood Pressure (Exercise)  107/73  100/72      Blood Pressure (Exit)  108/74  94/64       Heart Rate (Admit)  99 bpm  98 bpm      Heart Rate (Exercise)  109 bpm  111 bpm      Heart Rate (Exit)  97 bpm  98 bpm      Rating of Perceived Exertion (Exercise)  11  12      Symptoms  none  none      Comments  Pt tolerated 1st session of exercise fairly well at low intensity exercise.  -      Duration  Progress to 30 minutes of  aerobic without signs/symptoms of physical distress  Continue with 30 min of aerobic exercise without signs/symptoms of physical distress.      Intensity  THRR unchanged  -        Progression   Progression  Continue to progress workloads to maintain intensity without signs/symptoms of physical distress.  Continue to progress workloads to maintain intensity without signs/symptoms of physical distress.      Average METs  1.7  2        Resistance Training   Training Prescription  Yes  Yes      Weight  3 lbs.   3 lbs.       Reps  10-15  10-15      Time  10 Minutes  10 Minutes        Interval Training   Interval Training  No  No        Treadmill   MPH  -  -      Grade  -  -      Minutes  -  -        Recumbant Bike   Level  1  1.5      Watts  -  10      Minutes  8 Stopped early: to restroom.  15      METs  1.6  2.39        NuStep   Level  2  2      SPM  75  60      Minutes  15  15      METs  1.7  1.7         Exercise Comments: Exercise Comments    Row Name 12/18/18 1646 12/31/18 1407         Exercise Comments  Patient tolerated 1st session of exercise fairly well at low intensity exercise. Progress workloads as tolerated.  Unable to review METs and goals with Pt at this time due to absence from CR program.         Exercise Goals and Review: Exercise Goals    Row Name 12/08/18 1618             Exercise Goals   Increase Physical Activity  Yes       Intervention  Provide advice, education, support and counseling about physical activity/exercise needs.;Develop an individualized exercise prescription for aerobic and resistive training  based on initial evaluation findings, risk stratification, comorbidities and participant's personal goals.       Expected Outcomes  Short Term: Attend rehab on a regular basis to increase amount of physical activity.;Long Term: Add in home exercise to make exercise part of routine and to increase amount of physical activity.;Long Term: Exercising regularly at least 3-5 days a week.  Increase Strength and Stamina  Yes       Intervention  Provide advice, education, support and counseling about physical activity/exercise needs.;Develop an individualized exercise prescription for aerobic and resistive training based on initial evaluation findings, risk stratification, comorbidities and participant's personal goals.       Expected Outcomes  Short Term: Increase workloads from initial exercise prescription for resistance, speed, and METs.;Short Term: Perform resistance training exercises routinely during rehab and add in resistance training at home;Long Term: Improve cardiorespiratory fitness, muscular endurance and strength as measured by increased METs and functional capacity ( )       Able to understand and use rate of perceived exertion (RPE) scale  Yes       Intervention  Provide education and explanation on how to use RPE scale       Expected Outcomes  Short Term: Able to use RPE daily in rehab to express subjective intensity level;Long Term:  Able to use RPE to guide intensity level when exercising independently       Knowledge and understanding of Target Heart Rate Range (THRR)  Yes       Intervention  Provide education and explanation of THRR including how the numbers were predicted and where they are located for reference       Expected Outcomes  Short Term: Able to state/look up THRR;Long Term: Able to use THRR to govern intensity when exercising independently;Short Term: Able to use daily as guideline for intensity in rehab       Able to check pulse independently  Yes       Intervention   Provide education and demonstration on how to check pulse in carotid and radial arteries.;Review the importance of being able to check your own pulse for safety during independent exercise       Expected Outcomes  Short Term: Able to explain why pulse checking is important during independent exercise;Long Term: Able to check pulse independently and accurately       Understanding of Exercise Prescription  Yes       Intervention  Provide education, explanation, and written materials on patient's individual exercise prescription       Expected Outcomes  Short Term: Able to explain program exercise prescription;Long Term: Able to explain home exercise prescription to exercise independently          Exercise Goals Re-Evaluation : Exercise Goals Re-Evaluation    Row Name 12/18/18 1645 12/31/18 1405           Exercise Goal Re-Evaluation   Exercise Goals Review  Increase Physical Activity;Able to understand and use rate of perceived exertion (RPE) scale  Increase Physical Activity;Increase Strength and Stamina;Able to understand and use rate of perceived exertion (RPE) scale;Knowledge and understanding of Target Heart Rate Range (THRR);Understanding of Exercise Prescription      Comments  Patient able to understand and use RPE scale appropriately. Pt tolerated low intensity exercise fairly well without symtpoms.  Unable to review METs and goals with Pt due to absence from CR program. Pt has been tolerating exercise Rx well. Will follow up with Pt about goals when she returns.      Expected Outcomes  Increase workloads as tolerated to help increase energy and cardiorespiratory fitness.  Will continue to monitor and progress Pt as tolerated.         Discharge Exercise Prescription (Final Exercise Prescription Changes): Exercise Prescription Changes - 12/30/18 1500      Response to Exercise   Blood Pressure (Admit)  94/62  Blood Pressure (Exercise)  100/72    Blood Pressure (Exit)  94/64    Heart  Rate (Admit)  98 bpm    Heart Rate (Exercise)  111 bpm    Heart Rate (Exit)  98 bpm    Rating of Perceived Exertion (Exercise)  12    Symptoms  none    Duration  Continue with 30 min of aerobic exercise without signs/symptoms of physical distress.      Progression   Progression  Continue to progress workloads to maintain intensity without signs/symptoms of physical distress.    Average METs  2      Resistance Training   Training Prescription  Yes    Weight  3 lbs.     Reps  10-15    Time  10 Minutes      Interval Training   Interval Training  No      Recumbant Bike   Level  1.5    Watts  10    Minutes  15    METs  2.39      NuStep   Level  2    SPM  60    Minutes  15    METs  1.7       Nutrition:  Target Goals: Understanding of nutrition guidelines, daily intake of sodium 1500mg , cholesterol 200mg , calories 30% from fat and 7% or less from saturated fats, daily to have 5 or more servings of fruits and vegetables.  Biometrics: Pre Biometrics - 12/08/18 1619      Pre Biometrics   Height  5\' 4"  (1.626 m)    Weight  81.1 kg    Waist Circumference  41.5 inches    Hip Circumference  42.5 inches    Waist to Hip Ratio  0.98 %    BMI (Calculated)  30.67    Triceps Skinfold  26 mm    % Body Fat  40.6 %    Grip Strength  33 kg    Flexibility  13 in    Single Leg Stand  20.75 seconds        Nutrition Therapy Plan and Nutrition Goals:   Nutrition Assessments:   Nutrition Goals Re-Evaluation:   Nutrition Goals Re-Evaluation:   Nutrition Goals Discharge (Final Nutrition Goals Re-Evaluation):   Psychosocial: Target Goals: Acknowledge presence or absence of significant depression and/or stress, maximize coping skills, provide positive support system. Participant is able to verbalize types and ability to use techniques and skills needed for reducing stress and depression.  Initial Review & Psychosocial Screening: Initial Psych Review & Screening - 12/08/18  1608      Initial Review   Current issues with  Current Anxiety/Panic      Family Dynamics   Good Support System?  Yes   Lydia Taylor has her husband for support     Barriers   Psychosocial barriers to participate in program  The patient should benefit from training in stress management and relaxation.      Screening Interventions   Interventions  Encouraged to exercise       Quality of Life Scores: Quality of Life - 12/08/18 1625      Quality of Life   Select  Quality of Life      Quality of Life Scores   Health/Function Pre  16.1 %    Socioeconomic Pre  24.56 %    Psych/Spiritual Pre  23.64 %    Family Pre  26.4 %    GLOBAL Pre  21.01 %  Scores of 19 and below usually indicate a poorer quality of life in these areas.  A difference of  2-3 points is a clinically meaningful difference.  A difference of 2-3 points in the total score of the Quality of Life Index has been associated with significant improvement in overall quality of life, self-image, physical symptoms, and general health in studies assessing change in quality of life.  PHQ-9: Recent Review Flowsheet Data    Depression screen [facility] 2/9 12/08/2018 06/13/2016   Decreased Interest 0 0   Down, Depressed, Hopeless 0 1   PHQ - 2 Score 0 1     Interpretation of Total Score  Total Score Depression Severity:  1-4 = Minimal depression, 5-9 = Mild depression, 10-14 = Moderate depression, 15-19 = Moderately severe depression, 20-27 = Severe depression   Psychosocial Evaluation and Intervention: Psychosocial Evaluation - 12/31/18 1445      Psychosocial Evaluation & Interventions   Interventions  Encouraged to exercise with the program and follow exercise prescription;Relaxation education;Stress management education    Comments  No psychosocial interventions necessary at this time. Lydia Taylor enjoys spending time with family and shopping.    Expected Outcomes  Blue will maintain a positive outlook with good coping skills.     Continue Psychosocial Services   No Follow up required       Psychosocial Re-Evaluation: Psychosocial Re-Evaluation    Row Name 12/31/18 1454             Psychosocial Re-Evaluation   Current issues with  Current Anxiety/Panic       Comments  No psychosocial needs identified.       Expected Outcomes  Lydia Taylor will continue to maintain a positive outlook with good coping skills.       Interventions  Stress management education;Relaxation education;Encouraged to attend Cardiac Rehabilitation for the exercise       Continue Psychosocial Services   Follow up required by staff          Psychosocial Discharge (Final Psychosocial Re-Evaluation): Psychosocial Re-Evaluation - 12/31/18 1454      Psychosocial Re-Evaluation   Current issues with  Current Anxiety/Panic    Comments  No psychosocial needs identified.    Expected Outcomes  Lydia Taylor will continue to maintain a positive outlook with good coping skills.    Interventions  Stress management education;Relaxation education;Encouraged to attend Cardiac Rehabilitation for the exercise    Continue Psychosocial Services   Follow up required by staff       Vocational Rehabilitation: Provide vocational rehab assistance to qualifying candidates.   Vocational Rehab Evaluation & Intervention: Vocational Rehab - 12/08/18 1612      Initial Vocational Rehab Evaluation & Intervention   Assessment shows need for Vocational Rehabilitation  No       Education: Education Goals: Education classes will be provided on a weekly basis, covering required topics. Participant will state understanding/return demonstration of topics presented.  Learning Barriers/Preferences: Learning Barriers/Preferences - 12/08/18 1626      Learning Barriers/Preferences   Learning Barriers  Sight    Learning Preferences  Individual Instruction;Skilled Demonstration       Education Topics: Count Your Pulse:  -Group instruction provided by verbal instruction,  demonstration, patient participation and written materials to support subject.  Instructors address importance of being able to find your pulse and how to count your pulse when at home without a heart monitor.  Patients get hands on experience counting their pulse with staff help and individually.   Heart Attack, Angina,  and Risk Factor Modification:  -Group instruction provided by verbal instruction, video, and written materials to support subject.  Instructors address signs and symptoms of angina and heart attacks.    Also discuss risk factors for heart disease and how to make changes to improve heart health risk factors.   Functional Fitness:  -Group instruction provided by verbal instruction, demonstration, patient participation, and written materials to support subject.  Instructors address safety measures for doing things around the house.  Discuss how to get up and down off the floor, how to pick things up properly, how to safely get out of a chair without assistance, and balance training.   Meditation and Mindfulness:  -Group instruction provided by verbal instruction, patient participation, and written materials to support subject.  Instructor addresses importance of mindfulness and meditation practice to help reduce stress and improve awareness.  Instructor also leads participants through a meditation exercise.    Stretching for Flexibility and Mobility:  -Group instruction provided by verbal instruction, patient participation, and written materials to support subject.  Instructors lead participants through series of stretches that are designed to increase flexibility thus improving mobility.  These stretches are additional exercise for major muscle groups that are typically performed during regular warm up and cool down.   Hands Only CPR:  -Group verbal, video, and participation provides a basic overview of AHA guidelines for community CPR. Role-play of emergencies allow participants  the opportunity to practice calling for help and chest compression technique with discussion of AED use.   Hypertension: -Group verbal and written instruction that provides a basic overview of hypertension including the most recent diagnostic guidelines, risk factor reduction with self-care instructions and medication management.    Nutrition I class: Heart Healthy Eating:  -Group instruction provided by PowerPoint slides, verbal discussion, and written materials to support subject matter. The instructor gives an explanation and review of the Therapeutic Lifestyle Changes diet recommendations, which includes a discussion on lipid goals, dietary fat, sodium, fiber, plant stanol/sterol esters, sugar, and the components of a well-balanced, healthy diet.   Nutrition II class: Lifestyle Skills:  -Group instruction provided by PowerPoint slides, verbal discussion, and written materials to support subject matter. The instructor gives an explanation and review of label reading, grocery shopping for heart health, heart healthy recipe modifications, and ways to make healthier choices when eating out.   Diabetes Question & Answer:  -Group instruction provided by PowerPoint slides, verbal discussion, and written materials to support subject matter. The instructor gives an explanation and review of diabetes co-morbidities, pre- and post-prandial blood glucose goals, pre-exercise blood glucose goals, signs, symptoms, and treatment of hypoglycemia and hyperglycemia, and foot care basics.   Diabetes Blitz:  -Group instruction provided by PowerPoint slides, verbal discussion, and written materials to support subject matter. The instructor gives an explanation and review of the physiology behind type 1 and type 2 diabetes, diabetes medications and rational behind using different medications, pre- and post-prandial blood glucose recommendations and Hemoglobin A1c goals, diabetes diet, and exercise including blood  glucose guidelines for exercising safely.    Portion Distortion:  -Group instruction provided by PowerPoint slides, verbal discussion, written materials, and food models to support subject matter. The instructor gives an explanation of serving size versus portion size, changes in portions sizes over the last 20 years, and what consists of a serving from each food group.   Stress Management:  -Group instruction provided by verbal instruction, video, and written materials to support subject matter.  Instructors review role  of stress in heart disease and how to cope with stress positively.     Exercising on Your Own:  -Group instruction provided by verbal instruction, power point, and written materials to support subject.  Instructors discuss benefits of exercise, components of exercise, frequency and intensity of exercise, and end points for exercise.  Also discuss use of nitroglycerin and activating EMS.  Review options of places to exercise outside of rehab.  Review guidelines for sex with heart disease.   Cardiac Drugs I:  -Group instruction provided by verbal instruction and written materials to support subject.  Instructor reviews cardiac drug classes: antiplatelets, anticoagulants, beta blockers, and statins.  Instructor discusses reasons, side effects, and lifestyle considerations for each drug class.   Cardiac Drugs II:  -Group instruction provided by verbal instruction and written materials to support subject.  Instructor reviews cardiac drug classes: angiotensin converting enzyme inhibitors (ACE-I), angiotensin II receptor blockers (ARBs), nitrates, and calcium channel blockers.  Instructor discusses reasons, side effects, and lifestyle considerations for each drug class.   Anatomy and Physiology of the Circulatory System:  Group verbal and written instruction and models provide basic cardiac anatomy and physiology, with the coronary electrical and arterial systems. Review of: AMI,  Angina, Valve disease, Heart Failure, Peripheral Artery Disease, Cardiac Arrhythmia, Pacemakers, and the ICD.   Other Education:  -Group or individual verbal, written, or video instructions that support the educational goals of the cardiac rehab program.   Holiday Eating Survival Tips:  -Group instruction provided by PowerPoint slides, verbal discussion, and written materials to support subject matter. The instructor gives patients tips, tricks, and techniques to help them not only survive but enjoy the holidays despite the onslaught of food that accompanies the holidays.   Knowledge Questionnaire Score: Knowledge Questionnaire Score - 12/08/18 1621      Knowledge Questionnaire Score   Pre Score  20/24       Core Components/Risk Factors/Patient Goals at Admission: Personal Goals and Risk Factors at Admission - 12/08/18 1621      Core Components/Risk Factors/Patient Goals on Admission    Weight Management  Yes;Obesity;Weight Maintenance;Weight Loss    Admit Weight  178 lb 12.7 oz (81.1 kg)    Diabetes  Yes    Intervention  Provide education about signs/symptoms and action to take for hypo/hyperglycemia.;Provide education about proper nutrition, including hydration, and aerobic/resistive exercise prescription along with prescribed medications to achieve blood glucose in normal ranges: Fasting glucose 65-99 mg/dL    Expected Outcomes  Short Term: Participant verbalizes understanding of the signs/symptoms and immediate care of hyper/hypoglycemia, proper foot care and importance of medication, aerobic/resistive exercise and nutrition plan for blood glucose control.;Long Term: Attainment of HbA1C < 7%.    Heart Failure  Yes    Expected Outcomes  Improve functional capacity of life;Short term: Attendance in program 2-3 days a week with increased exercise capacity. Reported lower sodium intake. Reported increased fruit and vegetable intake. Reports medication compliance.;Short term: Daily  weights obtained and reported for increase. Utilizing diuretic protocols set by physician.;Long term: Adoption of self-care skills and reduction of barriers for early signs and symptoms recognition and intervention leading to self-care maintenance.    Hypertension  Yes    Intervention  Provide education on lifestyle modifcations including regular physical activity/exercise, weight management, moderate sodium restriction and increased consumption of fresh fruit, vegetables, and low fat dairy, alcohol moderation, and smoking cessation.;Monitor prescription use compliance.    Expected Outcomes  Short Term: Continued assessment and intervention until BP is <  140/58mm HG in hypertensive participants. < 130/68mm HG in hypertensive participants with diabetes, heart failure or chronic kidney disease.;Long Term: Maintenance of blood pressure at goal levels.    Lipids  Yes    Intervention  Provide education and support for participant on nutrition & aerobic/resistive exercise along with prescribed medications to achieve LDL 70mg , HDL >40mg .    Expected Outcomes  Short Term: Participant states understanding of desired cholesterol values and is compliant with medications prescribed. Participant is following exercise prescription and nutrition guidelines.;Long Term: Cholesterol controlled with medications as prescribed, with individualized exercise RX and with personalized nutrition plan. Value goals: LDL < 70mg , HDL > 40 mg.    Stress  Yes    Intervention  Offer individual and/or small group education and counseling on adjustment to heart disease, stress management and health-related lifestyle change. Teach and support self-help strategies.;Refer participants experiencing significant psychosocial distress to appropriate mental health specialists for further evaluation and treatment. When possible, include family members and significant others in education/counseling sessions.    Expected Outcomes  Short Term:  Participant demonstrates changes in health-related behavior, relaxation and other stress management skills, ability to obtain effective social support, and compliance with psychotropic medications if prescribed.;Long Term: Emotional wellbeing is indicated by absence of clinically significant psychosocial distress or social isolation.       Core Components/Risk Factors/Patient Goals Review:  Goals and Risk Factor Review    Row Name 12/31/18 1444             Core Components/Risk Factors/Patient Goals Review   Personal Goals Review  Weight Management/Obesity;Lipids;Stress;Hypertension;Diabetes       Review  Pt with multiple CAD RFs willing to participate in CR. Lydia Taylor is tolerting exercise well.  She is managing her chronic pain and exercise.       Expected Outcomes  Pt will continue to participate in CR exercise, nutrition, and lifestyle modification opportunities.          Core Components/Risk Factors/Patient Goals at Discharge (Final Review):  Goals and Risk Factor Review - 12/31/18 1444      Core Components/Risk Factors/Patient Goals Review   Personal Goals Review  Weight Management/Obesity;Lipids;Stress;Hypertension;Diabetes    Review  Pt with multiple CAD RFs willing to participate in CR. Lydia Taylor is tolerting exercise well.  She is managing her chronic pain and exercise.    Expected Outcomes  Pt will continue to participate in CR exercise, nutrition, and lifestyle modification opportunities.       ITP Comments: ITP Comments    Row Name 12/08/18 1607 12/31/18 1440         ITP Comments  Dr Fransico Him MD, Medical Director  30 Day ITP Review. Lydia Taylor is tolerting exercise well.  She is managing her chronic pain and exercise and misses some days subsequently.         Comments: See ITP Comments.

## 2019-01-01 ENCOUNTER — Encounter (HOSPITAL_COMMUNITY): Payer: Commercial Managed Care - PPO

## 2019-01-04 ENCOUNTER — Encounter (HOSPITAL_COMMUNITY): Payer: Commercial Managed Care - PPO

## 2019-01-06 ENCOUNTER — Encounter (HOSPITAL_COMMUNITY)
Admission: RE | Admit: 2019-01-06 | Discharge: 2019-01-06 | Disposition: A | Discharge status: Home / Self Care | Payer: Commercial Managed Care - PPO | Source: Ambulatory Visit | Attending: Cardiology | Admitting: Cardiology

## 2019-01-06 ENCOUNTER — Other Ambulatory Visit: Payer: Self-pay

## 2019-01-06 DIAGNOSIS — I5022 Chronic systolic (congestive) heart failure: Secondary | ICD-10-CM | POA: Diagnosis not present

## 2019-01-06 NOTE — Progress Notes (Signed)
Reviewed home exercise guidelines with patient including endpoints, temperature precautions, target heart rate and rate of perceived exertion. Pt is walking ,as tolerated, in the house as her mode of home exercise. Exercise is limited by chronic pain from fibromyalgia. Pt voices understanding of instructions given.  Sol Passer, MS, ACSM CEP

## 2019-01-08 ENCOUNTER — Ambulatory Visit (HOSPITAL_BASED_OUTPATIENT_CLINIC_OR_DEPARTMENT_OTHER)
Admission: RE | Admit: 2019-01-08 | Discharge: 2019-01-08 | Disposition: A | Discharge status: Home / Self Care | Payer: Commercial Managed Care - PPO | Source: Ambulatory Visit | Attending: Cardiology | Admitting: Cardiology

## 2019-01-08 ENCOUNTER — Encounter (HOSPITAL_COMMUNITY): Payer: Self-pay | Admitting: Cardiology

## 2019-01-08 ENCOUNTER — Ambulatory Visit (HOSPITAL_COMMUNITY)
Admission: RE | Admit: 2019-01-08 | Discharge: 2019-01-08 | Disposition: A | Discharge status: Home / Self Care | Payer: Commercial Managed Care - PPO | Source: Ambulatory Visit | Attending: Cardiology | Admitting: Cardiology

## 2019-01-08 ENCOUNTER — Encounter (HOSPITAL_COMMUNITY): Payer: Commercial Managed Care - PPO

## 2019-01-08 ENCOUNTER — Other Ambulatory Visit: Payer: Self-pay

## 2019-01-08 VITALS — BP 96/64 | HR 105 | Wt 178.0 lb

## 2019-01-08 DIAGNOSIS — G4733 Obstructive sleep apnea (adult) (pediatric): Secondary | ICD-10-CM

## 2019-01-08 DIAGNOSIS — Z79899 Other long term (current) drug therapy: Secondary | ICD-10-CM | POA: Diagnosis not present

## 2019-01-08 DIAGNOSIS — I429 Cardiomyopathy, unspecified: Secondary | ICD-10-CM | POA: Insufficient documentation

## 2019-01-08 DIAGNOSIS — I5022 Chronic systolic (congestive) heart failure: Secondary | ICD-10-CM | POA: Diagnosis present

## 2019-01-08 DIAGNOSIS — M797 Fibromyalgia: Secondary | ICD-10-CM | POA: Diagnosis not present

## 2019-01-08 DIAGNOSIS — I509 Heart failure, unspecified: Secondary | ICD-10-CM | POA: Diagnosis not present

## 2019-01-08 DIAGNOSIS — Z7984 Long term (current) use of oral hypoglycemic drugs: Secondary | ICD-10-CM | POA: Diagnosis not present

## 2019-01-08 DIAGNOSIS — E119 Type 2 diabetes mellitus without complications: Secondary | ICD-10-CM | POA: Diagnosis not present

## 2019-01-08 DIAGNOSIS — I428 Other cardiomyopathies: Secondary | ICD-10-CM | POA: Insufficient documentation

## 2019-01-08 DIAGNOSIS — Z9989 Dependence on other enabling machines and devices: Secondary | ICD-10-CM

## 2019-01-08 LAB — BASIC METABOLIC PANEL
Anion gap: 13 (ref 5–15)
BUN: 11 mg/dL (ref 6–20)
CO2: 22 mmol/L (ref 22–32)
Calcium: 9.4 mg/dL (ref 8.9–10.3)
Chloride: 104 mmol/L (ref 98–111)
Creatinine, Ser: 0.65 mg/dL (ref 0.44–1.00)
GFR calc Af Amer: >60 mL/min (ref >60)
GFR calc non Af Amer: >60 mL/min (ref >60)
Glucose, Bld: 165 mg/dL — ABNORMAL HIGH (ref 70–99)
Potassium: 3.8 mmol/L (ref 3.5–5.1)
Sodium: 139 mmol/L (ref 135–145)

## 2019-01-08 NOTE — Progress Notes (Signed)
  Echocardiogram 2D Echocardiogram has been performed.  Lydia Taylor 01/08/2019, 9:13 AM

## 2019-01-08 NOTE — Patient Instructions (Signed)
Office will call Corlanor rep to see what can be done about coverage. We will contact you with any updates.   Labs today We will only contact you if something comes back abnormal or we need to make some changes. Otherwise no news is good news!   Your physician has requested that you have a cardiac MRI. Cardiac MRI uses a computer to create images of your heart as its beating, producing both still and moving pictures of your heart and major blood vessels. For further information please visit [email or web address] Please follow the instruction sheet given to you today for more information.   Your physician recommends that you schedule a follow-up appointment in: 3 months with Dr Aundra Dubin with a Cardiac MRI prior.   At the [facility], you and your health needs are our priority. As part of our continuing mission to provide you with exceptional heart care, we have created designated Provider Care Teams. These Care Teams include your primary Cardiologist (physician) and Advanced Practice Providers (APPs- Physician Assistants and Nurse Practitioners) who all work together to provide you with the care you need, when you need it.   You may see any of the following providers on your designated Care Team at your next follow up: Marland Kitchen Dr Glori Bickers . Dr Loralie Champagne . Darrick Grinder, NP   Please be sure to bring in all your medications bottles to every appointment.

## 2019-01-09 NOTE — Progress Notes (Signed)
PCP: Dr. Darron Doom Cardiology: Dr. Gwenlyn Found HF Cardiology: Dr. Aundra Dubin  43 y.o. with history of diabetes and fibromyalgia was referred by Dr. Gwenlyn Found for evaluation of CHF.  For about 2 years, she has noted an elevated heart rate.  For over a year, she had noted dyspnea walking about 50 yards ("halfway around the grocery store").  She avoids stairs due to dyspnea.  She recalls an episode about 2 years ago when she developed severe chest pain and dyspnea but did not seek medical care.  She continues to work full time.  She does not drink ETOH or use drugs.  She has never been pregnant.  She has significant daytime sleepiness.   Cardiac MRI was done in 8/19 showing EF 20% with preserved RV function, no LGE.    Repeat echo was done in 11/19 showing EF 30% with normal RV size and systolic function.   Echo was done today and reviewed, EF 35% with normal RV size and systolic function.   She returns for followup of CHF.  She has been using CPAP, less fatigue.  Weight is down 7 lbs.  I put her on Corlanor and she felt much better symptomatically, but her insurance will not cover unless she is hospitalized, so unfortunately we had to stop it.   She still gets fatigued easily.  No dyspnea walking on flat ground.  Fibromyalgia pain has been acting up recently.  She is doing cardiac rehab.  Soft BP but not lightheaded.   Labs (5/19): K 4.8, creatinine 0.65 Labs (7/19): ANA negative, transferrin saturation 11%, SPEP negative.  Labs (8/19): K 3.8, creatinine 0.77 Labs (9/19): K 4.2, creatinine 0.57 Labs (10/19): hgb 14 Labs (2/20): K 4.3, creatinine 0.57  PMH: 1. Fibromyalgia 2. PCOS 3. Type II diabetes 4. Chronic systolic CHF: Nonischemic cardiomyopathy.  - Echo (4/19): Severe LV dilation with EF 20-25%, restrictive diastolic function.  - Coronary CTA (5/19): CAC 0, normal coronaries.  - Cardiac MRI (8/19): moderate LV dilation with EF 20%, normal RV size and systolic function, EF 40%.  No LGE.  - CPX  (9/19): RER 1.03, peak VO2 19, VE/VCO2 slope 38 => submaximal study, moderately decreased functional capacity.  - Echo (11/19): EF 30%, normal RV size and systolic function, no MR.  - Echo (8/20): EF 35%, normal RV size and systolic function.  5. Sinus tachycardia: Holter (5/19) with average HR 95, rare PVCs, no other arrhythmias.  6. OSA  SH: Rare ETOH, no smoking.  Married, no children or pregnancies.  Works full time in [employer].    FH: No history of CAD or cardiomyopathy.  ROS: All systems reviewed and negative except as per HPI.   Current Outpatient Medications  Medication Sig Dispense Refill  . Ascorbic Acid (VITAMIN C) 1000 MG tablet Take 1,000 mg by mouth daily.    . bisoprolol (ZEBETA) 10 MG tablet Take 1 tablet (10 mg total) by mouth daily. 30 tablet 11  . cholecalciferol (VITAMIN D3) 25 MCG (1000 UT) tablet Take 1,000 Units by mouth daily.    . clonazePAM (KLONOPIN) 0.5 MG tablet Take 0.5 mg by mouth 2 (two) times daily as needed for anxiety.    . empagliflozin (JARDIANCE) 25 MG TABS tablet Take 25 mg by mouth daily.    Marland Kitchen ENTRESTO 49-51 MG Take 1 tablet by mouth twice daily 60 tablet 5  . furosemide (LASIX) 20 MG tablet Take 60 mg by mouth daily.    Marland Kitchen glimepiride (AMARYL) 1 MG tablet TAKE 1 TABLET BY  MOUTH ONCE DAILY WITH BREAKFAST 90 tablet 0  . lamoTRIgine (LAMICTAL) 25 MG tablet Take 100 mg by mouth 2 (two) times daily.   1  . levothyroxine (SYNTHROID, LEVOTHROID) 25 MCG tablet Take 25 mcg by mouth daily before breakfast.    . metFORMIN (GLUCOPHAGE) 500 MG tablet Take 500 mg by mouth 4 (four) times daily.     . Multiple Vitamins-Minerals (HAIR SKIN AND NAILS FORMULA PO) Take 1 tablet by mouth. Nature's Bounty Hair, Skin, and Nails Gummies    . norethindrone (MICRONOR,CAMILA,ERRIN) 0.35 MG tablet Take 1 tablet by mouth daily.    . pravastatin (PRAVACHOL) 40 MG tablet TAKE 1 TABLET BY MOUTH ONCE DAILY 30 tablet 3  . Semaglutide (OZEMPIC) 0.25 or 0.5 MG/DOSE SOPN Inject 0.75 mg  into the skin once a week.     . spironolactone (ALDACTONE) 25 MG tablet TAKE 1 TABLET BY MOUTH  DAILY 90 tablet 3  . vitamin B-12 (CYANOCOBALAMIN) 1000 MCG tablet Take 2,500 mcg by mouth daily.     No current facility-administered medications for this encounter.    BP 96/64   Pulse (!) 105   Wt 80.7 kg (178 lb)   SpO2 98%   BMI 30.55 kg/m  General: NAD Neck: Thick, no JVD, no thyromegaly or thyroid nodule.  Lungs: Clear to auscultation bilaterally with normal respiratory effort. CV: Nondisplaced PMI.  Heart regular S1/S2, no S3/S4, no murmur.  No peripheral edema.  No carotid bruit.  Normal pedal pulses.  Abdomen: Soft, nontender, no hepatosplenomegaly, no distention.  Skin: Intact without lesions or rashes.  Neurologic: Alert and oriented x 3.  Psych: Normal affect. Extremities: No clubbing or cyanosis.  HEENT: Normal.   Assessment/Plan: 1. Chronic systolic CHF: Echo in 5/19 with severe LV dilation, EF 20-25%.  Coronary CT showed no evidence for CAD => nonischemic cardiomyopathy.  No prior pregnancies, so not peri-partum cardiomyopathy.  Mild sinus tachycardia, most recent holter with avg HR 95.  Do not think this is tachy-mediated CMP.  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  Cardiac MRI in 8/19 showed EF 20%, normal RV, no myocardial LGE.  CPX with moderate functional limitation from HF.  Echo in 11/19 showed EF 30%.  Echo was done today and reviewed, EF 35%.  LV function seems to be slowly improving.  She has NYHA class II symptoms.  She is not volume overloaded on exam.  HR remains elevated.  She felt better symptomatically on Corlanor, but her insurance company will not let her have it.  - Continue Entresto 49/51 bid, BP too soft to increase.   - Continue bisoprolol 10 mg daily.    - Continue spironolactone 25 mg daily.   - Continue Lasix 60 mg daily, BMET today.   - She is on empagliflozin.   - Narrow QRS, will not be CRT candidate.  She has had a persistently low EF but has been slowly improving.  I will repeat a cardiac MRI in 3 months for accurate EF quantification.  She has a nonischemic cardiomyopathy but is young, so I think she will benefit from ICD if EF on cMRI in 3 months is < 35%.   2. OSA: Awaiting CPAP.   Followup in in 3 months after MRI.   Marca Ancona 01/09/2019  -

## 2019-01-11 ENCOUNTER — Encounter (HOSPITAL_COMMUNITY): Payer: Commercial Managed Care - PPO

## 2019-01-13 ENCOUNTER — Other Ambulatory Visit: Payer: Self-pay

## 2019-01-13 ENCOUNTER — Encounter (HOSPITAL_COMMUNITY): Payer: Commercial Managed Care - PPO

## 2019-01-13 ENCOUNTER — Encounter: Payer: Self-pay | Admitting: Medical

## 2019-01-13 ENCOUNTER — Ambulatory Visit (INDEPENDENT_AMBULATORY_CARE_PROVIDER_SITE_OTHER): Payer: Commercial Managed Care - PPO | Admitting: Medical

## 2019-01-13 VITALS — BP 99/64 | HR 109 | Ht 64.0 in | Wt 178.0 lb

## 2019-01-13 DIAGNOSIS — M797 Fibromyalgia: Secondary | ICD-10-CM

## 2019-01-13 DIAGNOSIS — E039 Hypothyroidism, unspecified: Secondary | ICD-10-CM

## 2019-01-13 DIAGNOSIS — I5022 Chronic systolic (congestive) heart failure: Secondary | ICD-10-CM | POA: Diagnosis not present

## 2019-01-13 DIAGNOSIS — E785 Hyperlipidemia, unspecified: Secondary | ICD-10-CM

## 2019-01-13 DIAGNOSIS — E119 Type 2 diabetes mellitus without complications: Secondary | ICD-10-CM

## 2019-01-13 DIAGNOSIS — E1169 Type 2 diabetes mellitus with other specified complication: Secondary | ICD-10-CM

## 2019-01-13 DIAGNOSIS — R1011 Right upper quadrant pain: Secondary | ICD-10-CM

## 2019-01-13 DIAGNOSIS — R109 Unspecified abdominal pain: Secondary | ICD-10-CM | POA: Diagnosis not present

## 2019-01-13 DIAGNOSIS — E669 Obesity, unspecified: Secondary | ICD-10-CM

## 2019-01-13 MED ORDER — LAMOTRIGINE 25 MG PO TABS: ORAL_TABLET | ORAL | 1 refills | Status: DC

## 2019-01-13 NOTE — Patient Instructions (Signed)
For your CHF, diabetes, hyperlipidemia, low thyroid and fibromyalgia continue current medication regimen.  Chronic conditions appear to be relatively well controlled presently.  If you have worsening or changing symptoms please let me know.  Follow-up with specialist as regularly scheduled.  For your recently new right side abdomen pain, I did place future order labs to be done tomorrow morning.  Also placed order for abdomen ultrasound.  We will see if you have gallbladder etiology for your symptoms and see if H. pylori breath test positive.  If studies are negative consider prescribing PPI or H2 blocker.  Also she did mention mild constipated feeling but having relatively normal bowel movements.  But did suggest she could try Dulcolax if needed.  Follow-up this coming Tuesday or as needed.  If any major change in abdomen symptoms after hours or over the weekend then recommend ED evaluation.

## 2019-01-13 NOTE — Progress Notes (Signed)
Subjective:    Patient ID: Lydia Taylor, female    DOB: 19-Jul-1975, 43 y.o.   MRN: [id number]  HPI  Virtual Visit via Video Note  I connected with Cheri Fowler on 01/13/19 at  1:40 PM EDT by a video enabled telemedicine application and verified that I am speaking with the correct person using two identifiers.  Location: Patient: homr Provider: office   I discussed the limitations of evaluation and management by telemedicine and the availability of in person appointments. The patient expressed understanding and agreed to proceed.  History of Present Illness:  Pt was with former pcp in area in [place]. Pt was with [facility]. She was with Selena Batten.  Pt works part time. 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 Pt has tries to do cardiac rehab. Was doing virtual exercise.   Pt has hx of fibromyalgia. Pt in past was on lyrica and cymbalta. She states it did not help much at all. Not on any meds presently.  Has hx of chf..Doing cardiac rehab one time a week. Last EF 35%. She states original dx never had sob. She was referred due to high pulse then got worked up. She states gets tired but never real sob. Pt has no pedal edema presently. Occasionally will get rare swelling.  Pt is on Entresto and bisoprolol. Cardiologist might give new medicine but insurance allowing new med to help control heart rate. Also on lasix.  Pt is diabetic. Her last a1c was 8.3. Pt sees endocrinologist at [facility] just 2 weeks ago. Pt is on jardiance, metformin, ozempic and amaryl.  Pt also on lipitor for high cholesterol.   Pt has low thyroid but level good recently. On supplementation 25 mcg.  Pt bp is on lower end. She runs low historically. Pt pulse usually in low 100's.  Pt in the past used clonazepam for anxiety sporadically in the past. She has current rx.. She would use this med 3-4 times over past 2 yars.  Pt in the past lamictal seems to help her with mood and  keeps mood more even. Sometimes would have short fuse and other times emotional. Pt states no dx of bipolar. Pt has been off med for 2 weeks.  Pt has some rt side abdomen pain. This pain moderate to severe. Pain for few months. Has been progressively worse. Some occasional vomiting. Pain can hurt random. Sometimes occurs after eating. Sometimes not. No cva pain that she notes recently. Start rt side. Somes cross to left side.  lmp- last week.  Observations/Objective:  General-no acute distress, pleasant, oriented. Lungs- on inspection lungs appear unlabored. Neck- no tracheal deviation or jvd on inspection. Neuro- gross motor function appears intact.  Abdomen- mild rt lower quadrant pain on palpation.   Assessment and Plan: For your CHF, diabetes, hyperlipidemia, low thyroid and fibromyalgia continue current medication regimen.  Chronic conditions appear to be relatively well controlled presently.  If you have worsening or changing symptoms please let me know.  Follow-up with specialist as regularly scheduled.  For your recently new right side abdomen pain, I did place future order labs to be done tomorrow morning.  Also placed order for abdomen ultrasound.  We will see if you have gallbladder etiology for your symptoms and see if H. pylori breath test positive.  If studies are negative consider prescribing PPI or H2 blocker.  Also she did mention mild constipated feeling but having relatively normal bowel movements.  But did suggest she could try  Dulcolax if needed.  Follow-up this coming Tuesday or as needed.  If any major change in abdomen symptoms after hours or over the weekend then recommend ED evaluation.  45 minutes spent with new pt.  50% of time spent counseling on dx and tx plan.   Follow Up Instructions:    I discussed the assessment and treatment plan with the patient. The patient was provided an opportunity to ask questions and all were answered. The patient agreed with  the plan and demonstrated an understanding of the instructions.   The patient was advised to call back or seek an in-person evaluation if the symptoms worsen or if the condition fails to improve as anticipated.  I provided 45  minutes of non-face-to-face time during this encounter.   Mackie Pai, PA-C   Review of Systems  Constitutional: Negative for chills, fatigue and fever.  HENT: Negative for congestion.   Respiratory: Negative for cough, chest tightness, shortness of breath and wheezing.   Cardiovascular: Negative for chest pain and palpitations.  Gastrointestinal: Positive for abdominal pain, constipation, nausea and vomiting. Negative for abdominal distention and diarrhea.       Some slight constipated sensation. Decreased appetite.   Usual bm pattern once a day. Past 2 weeks one bm every 2-3 days. Normal size bm.  Vomited one time last week. And one time 2 weeks prior to that. Rare nausea.  Genitourinary: Negative for decreased urine volume, dysuria, flank pain, frequency and pelvic pain.  Musculoskeletal: Negative for gait problem, neck pain and neck stiffness.  Skin: Negative for rash.  Neurological: Negative for dizziness, seizures, syncope, weakness and light-headedness.  Hematological: Negative for adenopathy.  Psychiatric/Behavioral: Positive for dysphoric mood. Negative for behavioral problems, sleep disturbance and suicidal ideas. The patient is nervous/anxious.        Objective:   Physical Exam        Assessment & Plan:

## 2019-01-14 ENCOUNTER — Encounter: Payer: Self-pay | Admitting: Medical

## 2019-01-14 ENCOUNTER — Other Ambulatory Visit (INDEPENDENT_AMBULATORY_CARE_PROVIDER_SITE_OTHER): Payer: Commercial Managed Care - PPO

## 2019-01-14 ENCOUNTER — Other Ambulatory Visit: Payer: Self-pay

## 2019-01-14 DIAGNOSIS — R109 Unspecified abdominal pain: Secondary | ICD-10-CM

## 2019-01-14 DIAGNOSIS — R1011 Right upper quadrant pain: Secondary | ICD-10-CM | POA: Diagnosis not present

## 2019-01-14 LAB — COMPREHENSIVE METABOLIC PANEL
ALT: 24 U/L (ref 0–35)
AST: 21 U/L (ref 0–37)
Albumin: 4.4 g/dL (ref 3.5–5.2)
Alkaline Phosphatase: 57 U/L (ref 39–117)
BUN: 11 mg/dL (ref 6–23)
CO2: 29 mEq/L (ref 19–32)
Calcium: 10 mg/dL (ref 8.4–10.5)
Chloride: 101 mEq/L (ref 96–112)
Creatinine, Ser: 0.66 mg/dL (ref 0.40–1.20)
GFR: 118.38 mL/min (ref >60.00)
Glucose, Bld: 184 mg/dL — ABNORMAL HIGH (ref 70–99)
Potassium: 4.5 mEq/L (ref 3.5–5.1)
Sodium: 139 mEq/L (ref 135–145)
Total Bilirubin: 0.6 mg/dL (ref 0.2–1.2)
Total Protein: 7.5 g/dL (ref 6.0–8.3)

## 2019-01-14 LAB — CBC WITH DIFFERENTIAL/PLATELET
Basophils Absolute: 0 10*3/uL (ref 0.0–0.1)
Basophils Relative: 0.7 % (ref 0.0–3.0)
Eosinophils Absolute: 0.3 10*3/uL (ref 0.0–0.7)
Eosinophils Relative: 4 % (ref 0.0–5.0)
HCT: 41.2 % (ref 36.0–46.0)
Hemoglobin: 14.4 g/dL (ref 12.0–15.0)
Lymphocytes Relative: 33.1 % (ref 12.0–46.0)
Lymphs Abs: 2.3 10*3/uL (ref 0.7–4.0)
MCHC: 34.8 g/dL (ref 30.0–36.0)
MCV: 88.3 fl (ref 78.0–100.0)
Monocytes Absolute: 0.6 10*3/uL (ref 0.1–1.0)
Monocytes Relative: 8.5 % (ref 3.0–12.0)
Neutro Abs: 3.7 10*3/uL (ref 1.4–7.7)
Neutrophils Relative %: 53.7 % (ref 43.0–77.0)
Platelets: 462 10*3/uL — ABNORMAL HIGH (ref 150.0–400.0)
RBC: 4.66 Mil/uL (ref 3.87–5.11)
RDW: 13.2 % (ref 11.5–15.5)
WBC: 6.9 10*3/uL (ref 4.0–10.5)

## 2019-01-14 LAB — LIPASE: Lipase: 28 U/L (ref 11.0–59.0)

## 2019-01-15 ENCOUNTER — Ambulatory Visit (HOSPITAL_BASED_OUTPATIENT_CLINIC_OR_DEPARTMENT_OTHER)
Admission: RE | Admit: 2019-01-15 | Discharge: 2019-01-15 | Disposition: A | Discharge status: Home / Self Care | Payer: Commercial Managed Care - PPO | Source: Ambulatory Visit | Attending: Medical | Admitting: Medical

## 2019-01-15 ENCOUNTER — Encounter (HOSPITAL_COMMUNITY): Payer: Commercial Managed Care - PPO

## 2019-01-15 ENCOUNTER — Other Ambulatory Visit (HOSPITAL_COMMUNITY): Payer: Self-pay

## 2019-01-15 DIAGNOSIS — R1011 Right upper quadrant pain: Secondary | ICD-10-CM | POA: Diagnosis present

## 2019-01-15 LAB — H. PYLORI BREATH TEST: H. pylori Breath Test: NOT DETECTED

## 2019-01-15 MED ORDER — IVABRADINE HCL 5 MG PO TABS: 5.0000 mg | ORAL_TABLET | Freq: Two times a day (BID) | ORAL | 6 refills | Status: DC

## 2019-01-15 NOTE — Telephone Encounter (Signed)
Pt initially denied corlanor, Pharmacy [facility] will assist with patient approval/ pt assistance for medciine. Per Dr Aundra Dubin, ok to order Corlanor 5mg  BID

## 2019-01-16 ENCOUNTER — Telehealth: Payer: Self-pay | Admitting: Medical

## 2019-01-16 DIAGNOSIS — K8020 Calculus of gallbladder without cholecystitis without obstruction: Secondary | ICD-10-CM

## 2019-01-16 NOTE — Telephone Encounter (Signed)
Referral to general surgeon placed. 

## 2019-01-19 ENCOUNTER — Ambulatory Visit: Payer: Commercial Managed Care - PPO | Admitting: Medical

## 2019-01-19 DIAGNOSIS — Z0289 Encounter for other administrative examinations: Secondary | ICD-10-CM

## 2019-01-20 ENCOUNTER — Encounter (HOSPITAL_COMMUNITY): Payer: Commercial Managed Care - PPO

## 2019-01-22 ENCOUNTER — Encounter (HOSPITAL_COMMUNITY)
Admission: RE | Admit: 2019-01-22 | Discharge: 2019-01-22 | Disposition: A | Discharge status: Home / Self Care | Payer: Commercial Managed Care - PPO | Source: Ambulatory Visit | Attending: Cardiology | Admitting: Cardiology

## 2019-01-22 ENCOUNTER — Other Ambulatory Visit: Payer: Self-pay

## 2019-01-22 VITALS — BP 89/50 | HR 98 | Temp 97.5°F | Wt 178.8 lb

## 2019-01-22 DIAGNOSIS — Z7984 Long term (current) use of oral hypoglycemic drugs: Secondary | ICD-10-CM | POA: Diagnosis not present

## 2019-01-22 DIAGNOSIS — E282 Polycystic ovarian syndrome: Secondary | ICD-10-CM | POA: Insufficient documentation

## 2019-01-22 DIAGNOSIS — M797 Fibromyalgia: Secondary | ICD-10-CM | POA: Diagnosis not present

## 2019-01-22 DIAGNOSIS — E119 Type 2 diabetes mellitus without complications: Secondary | ICD-10-CM | POA: Insufficient documentation

## 2019-01-22 DIAGNOSIS — Z79890 Hormone replacement therapy (postmenopausal): Secondary | ICD-10-CM | POA: Insufficient documentation

## 2019-01-22 DIAGNOSIS — F39 Unspecified mood [affective] disorder: Secondary | ICD-10-CM | POA: Insufficient documentation

## 2019-01-22 DIAGNOSIS — G4733 Obstructive sleep apnea (adult) (pediatric): Secondary | ICD-10-CM | POA: Insufficient documentation

## 2019-01-22 DIAGNOSIS — Z79899 Other long term (current) drug therapy: Secondary | ICD-10-CM | POA: Insufficient documentation

## 2019-01-22 DIAGNOSIS — I5022 Chronic systolic (congestive) heart failure: Secondary | ICD-10-CM

## 2019-01-22 NOTE — Progress Notes (Signed)
Discharge Progress Report  Patient Details  Name: Lydia Taylor MRN: [id number] Date of Birth: June 24, 1975 Referring Provider:     [place] from 12/08/2018 in [place]  Referring Provider  Dr. Aundra Dubin       Number of Visits: 6  Reason for Discharge:  Patient independent in their exercise.   Smoking History:  Social History   Tobacco Use  Smoking Status Never Smoker  Smokeless Tobacco Never Used    Diagnosis:  Heart failure, chronic systolic (HCC)  ADL UCSD:   Initial Exercise Prescription: Initial Exercise Prescription - 12/08/18 1600      Date of Initial Exercise RX and Referring Provider   Date  12/08/18    Referring Provider  Dr. Aundra Dubin    Expected Discharge Date  01/22/19      Treadmill   MPH  2    Grade  0    Minutes  15      NuStep   Level  2    SPM  75    Minutes  15    METs  3      Prescription Details   Frequency (times per week)  3    Duration  Progress to 30 minutes of continuous aerobic without signs/symptoms of physical distress      Intensity   THRR 40-80% of Max Heartrate  71-142    Ratings of Perceived Exertion  11-13      Progression   Progression  Continue to progress workloads to maintain intensity without signs/symptoms of physical distress.      Resistance Training   Training Prescription  Yes    Weight  3 lbs.     Reps  10-15       Discharge Exercise Prescription (Final Exercise Prescription Changes): Exercise Prescription Changes - 01/06/19 1550      Response to Exercise   Blood Pressure (Admit)  94/60    Blood Pressure (Exercise)  118/60    Blood Pressure (Exit)  92/62    Heart Rate (Admit)  99 bpm    Heart Rate (Exercise)  110 bpm    Heart Rate (Exit)  97 bpm    Rating of Perceived Exertion (Exercise)  15    Symptoms  fibromyalgia pain    Comments  Pt last day of exercise.    Duration  Continue with 30 min of aerobic exercise without signs/symptoms of  physical distress.    Intensity  THRR unchanged      Progression   Progression  Continue to progress workloads to maintain intensity without signs/symptoms of physical distress.    Average METs  1.5      Resistance Training   Training Prescription  No      Recumbant Bike   Level  1    Minutes  15    METs  1.4      NuStep   Level  2    SPM  60    Minutes  15    METs  1.6       Functional Capacity: 6 Minute Walk    Row Name 12/08/18 1615 01/22/19 1541       6 Minute Walk   Phase  Initial  Discharge    Distance  1227 feet  1015 feet    Distance % Change  -  -17.28 %    Distance Feet Change  -  -212 ft    Walk Time  6 minutes  6 minutes    # of Rest Breaks  0  0    MPH  2.3  1.9    METS  3.7  3.5    RPE  11  13    Perceived Dyspnea   0  0    VO2 Peak  13.11  12.39    Symptoms  No  Yes (comment)    Comments  -  Chronic pain 7/10    Resting HR  90 bpm  98 bpm    Resting BP  90/62  89/50    Resting Oxygen Saturation   99 %  -    Exercise Oxygen Saturation  during 6 min walk  99 %  -    Max Ex. HR  102 bpm  111 bpm    Max Ex. BP  85/61 Gatorade given. Asymptomatic  98/58    2 Minute Post BP  103/73  84/60       Psychological, QOL, Others - Outcomes: PHQ 2/9: Depression screen [facility] 2/9 01/22/2019 12/08/2018 06/13/2016  Decreased Interest 0 0 0  Down, Depressed, Hopeless 1 0 1  PHQ - 2 Score 1 0 1    Quality of Life: Quality of Life - 01/22/19 1544      Quality of Life Scores   Health/Function Post  17.6 %    Socioeconomic Post  24.86 %    Psych/Spiritual Post  21.36 %    Family Post  28.8 %    GLOBAL Post  21.51 %       Personal Goals: Goals established at orientation with interventions provided to work toward goal. Personal Goals and Risk Factors at Admission - 12/08/18 1621      Core Components/Risk Factors/Patient Goals on Admission    Weight Management  Yes;Obesity;Weight Maintenance;Weight Loss    Admit Weight  178 lb 12.7 oz (81.1 kg)    Diabetes   Yes    Intervention  Provide education about signs/symptoms and action to take for hypo/hyperglycemia.;Provide education about proper nutrition, including hydration, and aerobic/resistive exercise prescription along with prescribed medications to achieve blood glucose in normal ranges: Fasting glucose 65-99 mg/dL    Expected Outcomes  Short Term: Participant verbalizes understanding of the signs/symptoms and immediate care of hyper/hypoglycemia, proper foot care and importance of medication, aerobic/resistive exercise and nutrition plan for blood glucose control.;Long Term: Attainment of HbA1C < 7%.    Heart Failure  Yes    Expected Outcomes  Improve functional capacity of life;Short term: Attendance in program 2-3 days a week with increased exercise capacity. Reported lower sodium intake. Reported increased fruit and vegetable intake. Reports medication compliance.;Short term: Daily weights obtained and reported for increase. Utilizing diuretic protocols set by physician.;Long term: Adoption of self-care skills and reduction of barriers for early signs and symptoms recognition and intervention leading to self-care maintenance.    Hypertension  Yes    Intervention  Provide education on lifestyle modifcations including regular physical activity/exercise, weight management, moderate sodium restriction and increased consumption of fresh fruit, vegetables, and low fat dairy, alcohol moderation, and smoking cessation.;Monitor prescription use compliance.    Expected Outcomes  Short Term: Continued assessment and intervention until BP is < 140/23m HG in hypertensive participants. < 130/846mHG in hypertensive participants with diabetes, heart failure or chronic kidney disease.;Long Term: Maintenance of blood pressure at goal levels.    Lipids  Yes    Intervention  Provide education and support for participant on nutrition & aerobic/resistive exercise along with prescribed medications  to achieve LDL <74m, HDL  >457m    Expected Outcomes  Short Term: Participant states understanding of desired cholesterol values and is compliant with medications prescribed. Participant is following exercise prescription and nutrition guidelines.;Long Term: Cholesterol controlled with medications as prescribed, with individualized exercise RX and with personalized nutrition plan. Value goals: LDL < 7077mHDL > 40 mg.    Stress  Yes    Intervention  Offer individual and/or small group education and counseling on adjustment to heart disease, stress management and health-related lifestyle change. Teach and support self-help strategies.;Refer participants experiencing significant psychosocial distress to appropriate mental health specialists for further evaluation and treatment. When possible, include family members and significant others in education/counseling sessions.    Expected Outcomes  Short Term: Participant demonstrates changes in health-related behavior, relaxation and other stress management skills, ability to obtain effective social support, and compliance with psychotropic medications if prescribed.;Long Term: Emotional wellbeing is indicated by absence of clinically significant psychosocial distress or social isolation.        Personal Goals Discharge: Goals and Risk Factor Review    Row Name 12/31/18 1444 01/22/19 1629           Core Components/Risk Factors/Patient Goals Review   Personal Goals Review  Weight Management/Obesity;Lipids;Stress;Hypertension;Diabetes  Weight Management/Obesity;Diabetes;Heart Failure;Hypertension;Lipids      Review  Pt with multiple CAD RFs willing to participate in CR. JaiJenisha tolerting exercise well.  She is managing her chronic pain and exercise.  Pt with multiple CAD RFs and willing to participate however she is extremely limited by her chronic pain. Myan tolerated exercise well when she attended.      Expected Outcomes  Pt will continue to participate in CR exercise,  nutrition, and lifestyle modification opportunities.  JamRoselyn Reefll continue her lifestyle modification and healthy nutrition however she does not plan to exercise until the weather cools off.         Exercise Goals and Review: Exercise Goals    Row Name 12/08/18 1618             Exercise Goals   Increase Physical Activity  Yes       Intervention  Provide advice, education, support and counseling about physical activity/exercise needs.;Develop an individualized exercise prescription for aerobic and resistive training based on initial evaluation findings, risk stratification, comorbidities and participant's personal goals.       Expected Outcomes  Short Term: Attend rehab on a regular basis to increase amount of physical activity.;Long Term: Add in home exercise to make exercise part of routine and to increase amount of physical activity.;Long Term: Exercising regularly at least 3-5 days a week.       Increase Strength and Stamina  Yes       Intervention  Provide advice, education, support and counseling about physical activity/exercise needs.;Develop an individualized exercise prescription for aerobic and resistive training based on initial evaluation findings, risk stratification, comorbidities and participant's personal goals.       Expected Outcomes  Short Term: Increase workloads from initial exercise prescription for resistance, speed, and METs.;Short Term: Perform resistance training exercises routinely during rehab and add in resistance training at home;Long Term: Improve cardiorespiratory fitness, muscular endurance and strength as measured by increased METs and functional capacity (6MWT)       Able to understand and use rate of perceived exertion (RPE) scale  Yes       Intervention  Provide education and explanation on how to use RPE scale  Expected Outcomes  Short Term: Able to use RPE daily in rehab to express subjective intensity level;Long Term:  Able to use RPE to guide intensity  level when exercising independently       Knowledge and understanding of Target Heart Rate Range (THRR)  Yes       Intervention  Provide education and explanation of THRR including how the numbers were predicted and where they are located for reference       Expected Outcomes  Short Term: Able to state/look up THRR;Long Term: Able to use THRR to govern intensity when exercising independently;Short Term: Able to use daily as guideline for intensity in rehab       Able to check pulse independently  Yes       Intervention  Provide education and demonstration on how to check pulse in carotid and radial arteries.;Review the importance of being able to check your own pulse for safety during independent exercise       Expected Outcomes  Short Term: Able to explain why pulse checking is important during independent exercise;Long Term: Able to check pulse independently and accurately       Understanding of Exercise Prescription  Yes       Intervention  Provide education, explanation, and written materials on patient's individual exercise prescription       Expected Outcomes  Short Term: Able to explain program exercise prescription;Long Term: Able to explain home exercise prescription to exercise independently          Exercise Goals Re-Evaluation: Exercise Goals Re-Evaluation    Row Name 12/18/18 1645 12/31/18 1405 01/06/19 1526 01/22/19 1552       Exercise Goal Re-Evaluation   Exercise Goals Review  Increase Physical Activity;Able to understand and use rate of perceived exertion (RPE) scale  Increase Physical Activity;Increase Strength and Stamina;Able to understand and use rate of perceived exertion (RPE) scale;Knowledge and understanding of Target Heart Rate Range (THRR);Understanding of Exercise Prescription  Increase Physical Activity;Increase Strength and Stamina;Able to understand and use rate of perceived exertion (RPE) scale;Knowledge and understanding of Target Heart Rate Range  (THRR);Understanding of Exercise Prescription;Able to check pulse independently  Increase Physical Activity;Increase Strength and Stamina;Able to understand and use rate of perceived exertion (RPE) scale;Knowledge and understanding of Target Heart Rate Range (THRR);Able to check pulse independently;Understanding of Exercise Prescription    Comments  Patient able to understand and use RPE scale appropriately. Pt tolerated low intensity exercise fairly well without symtpoms.  Unable to review METs and goals with Pt due to absence from CR program. Pt has been tolerating exercise Rx well. Will follow up with Pt about goals when she returns.  Reviewed home exercise guidelines with patient. Pt has a heart rate monitor to check her pulse independently. Pt is walking and stretching as tolerated as her mode of exercise. Pt walks in place at home and has 5 lb hand weights that she uses for resistance exercise as tolerated. Exercise is limited by fibromyalgia.  Pt completed CR program today. Pt ended with a MET level of 1.5. Pt stated she was not exercising at home. Encouraged Pt to start exercising at a slow pace, 10-15 and build up everyday. Pt was encouraged to add a minute each day to her walking time.    Expected Outcomes  Increase workloads as tolerated to help increase energy and cardiorespiratory fitness.  Will continue to monitor and progress Pt as tolerated.  Increase workloads as tolerated to help increase energy.  Pt will exercise on  her own.       Nutrition & Weight - Outcomes: Pre Biometrics - 12/08/18 1619      Pre Biometrics   Height  '5\' 4"'  (1.626 m)    Weight  81.1 kg    Waist Circumference  41.5 inches    Hip Circumference  42.5 inches    Waist to Hip Ratio  0.98 %    BMI (Calculated)  30.67    Triceps Skinfold  26 mm    % Body Fat  40.6 %    Grip Strength  33 kg    Flexibility  13 in    Single Leg Stand  20.75 seconds        Nutrition:   Nutrition Discharge:   Education  Questionnaire Score: Knowledge Questionnaire Score - 01/22/19 1541      Knowledge Questionnaire Score   Post Score  21/24       Goals reviewed with patient; copy given to patient. Pt graduated from cardiac rehab program today with completion of 6 exercise sessions in Phase II. Pt maintained good attendance and progressed nicely during his participation in rehab as evidenced by increased MET level. Medication list reconciled. Repeat  PHQ score-1. Pt has made significant lifestyle changes and should be commended for his success. Pt feels he has achieved his goals during cardiac rehab.

## 2019-01-25 ENCOUNTER — Encounter (HOSPITAL_COMMUNITY)
Admission: RE | Admit: 2019-01-25 | Discharge: 2019-01-25 | Disposition: A | Discharge status: Home / Self Care | Payer: Commercial Managed Care - PPO | Source: Ambulatory Visit | Attending: Cardiology | Admitting: Cardiology

## 2019-01-26 ENCOUNTER — Telehealth (HOSPITAL_COMMUNITY): Payer: Self-pay | Admitting: Pharmacy Technician

## 2019-01-26 NOTE — Telephone Encounter (Signed)
Started application for patient to get assistance for Corlanor from [employer]. Took application to front check in desk for patient to come and sign.  Will continue to follow.  Charlann Boxer, CPhT

## 2019-01-29 ENCOUNTER — Encounter (HOSPITAL_COMMUNITY): Payer: Self-pay

## 2019-01-29 NOTE — Telephone Encounter (Signed)
Patient came in and filled in application. Sent via fax to [facility] today.  Phone #: [phone]  Fax [phone]

## 2019-02-05 ENCOUNTER — Telehealth (HOSPITAL_COMMUNITY): Payer: Self-pay | Admitting: Emergency Medicine

## 2019-02-05 NOTE — Telephone Encounter (Signed)
Left message on voicemail with name and callback number Trafton Roker RN Navigator Cardiac Imaging [facility] [phone] Office [phone] Cell  

## 2019-02-08 ENCOUNTER — Encounter (HOSPITAL_COMMUNITY): Payer: Self-pay | Admitting: *Deleted

## 2019-02-09 ENCOUNTER — Other Ambulatory Visit: Payer: Self-pay

## 2019-02-09 ENCOUNTER — Ambulatory Visit (HOSPITAL_COMMUNITY)
Admission: RE | Admit: 2019-02-09 | Discharge: 2019-02-09 | Disposition: A | Discharge status: Home / Self Care | Payer: Commercial Managed Care - PPO | Source: Ambulatory Visit | Attending: Cardiology | Admitting: Cardiology

## 2019-02-09 DIAGNOSIS — I5022 Chronic systolic (congestive) heart failure: Secondary | ICD-10-CM

## 2019-02-09 MED ORDER — GADOBUTROL 1 MMOL/ML IV SOLN
9.0000 mL | Freq: Once | INTRAVENOUS | Status: AC | PRN
  Administered 2019-02-09: 13:00:00 9 mL via INTRAVENOUS

## 2019-02-10 ENCOUNTER — Telehealth (HOSPITAL_COMMUNITY): Payer: Self-pay

## 2019-02-10 NOTE — Telephone Encounter (Signed)
Received information from insurance company and faxed over to [facility].  Will continue to follow.  Charlann Boxer, CPhT

## 2019-02-10 NOTE — Telephone Encounter (Signed)
Pt aware of results and appreciative.  

## 2019-02-10 NOTE — Telephone Encounter (Signed)
Received a notification from [facility] denying patient assistance at this time. The reason for denial is that the patients insurance covers the medication. Called [employer] and informed them of the denied PA that was sent for the patient. They require [place] to send the denial from the insurance company to reconsider patient assistance.  Called insurance who is going to fax over the denial and the denied appeal from march of this year, then will send that to [facility].  Called to update the patient on what is going on. Let her a message to call me back.  Will continue to follow.  Charlann Boxer, CPhT

## 2019-02-10 NOTE — Telephone Encounter (Signed)
-----   Message from Larey Dresser, MD sent at 02/09/2019  4:10 PM EDT ----- LV EF 42%, this is above ICD range (good news).

## 2019-02-11 ENCOUNTER — Ambulatory Visit: Payer: Self-pay | Admitting: Surgery

## 2019-02-11 NOTE — H&P (Signed)
Lydia Taylor DOB: Jan 05, 1976   History of Present Illness Patient words: This is a very pleasant 43 year old woman is referred for right upper quadrant pain and recent ultrasound finding of cholelithiasis.  She states that she has been having intermittent mild right lower anterior rib cage pain for several months. It seems to be somewhat random, is not brought on by any specific activities and is not associated with eating. Has A few times. She does note issues with nausea and has had just a couple episodes of emesis. Denies any fevers, jaundice, or other abdominal pain. Overall she considers her symptoms to be mild. She has been trying to be more conscientious about not eating greasy or fatty foods recently.  Ultrasound performed 01/15/19 shows multiple gallstones, slight thickening of the gallbladder wall, negative Murphy sign. Common bile duct 5.2 mm. Increased liver echogenicity suggestive of hepatic steatosis.  Her history is notable for congestive heart failure with an ejection fraction of 35%. She states that this is thought to be secondary to some viral infection. Also has fibromyalgia, obstructive sleep apnea on CPAP, diabetes, polycystic ovary syndrome. Denies any prior abdominal surgeries. No tobacco abuse.   Past Surgical History Mammoplasty; Reduction  Bilateral. Oral Surgery   Diagnostic Studies History  Colonoscopy  never Mammogram  within last year Pap Smear  1-5 years ago  Allergies Amoxicillin *PENICILLINS*    Medication History  Bisoprolol Fumarate (10MG  Tablet, Oral) Active. Entresto (49-51MG  Tablet, Oral) Active. Jardiance (25MG  Tablet, Oral) Active. lamoTRIgine (25MG  Tablet, Oral) Active. Furosemide (40MG  Tablet, Oral) Active. Glimepiride (1MG  Tablet, Oral) Active. Jencycla (0.35MG  Tablet, Oral) Active. metFORMIN HCl ER (500MG  Tablet ER 24HR, Oral) Active. Levothyroxine Sodium ( Tablet, Oral) Active. Ozempic (1 MG/DOSE)  (2MG /1.5ML Soln Pen-inj, Subcutaneous) Active. Spironolactone (25MG  Tablet, Oral) Active. Lipitor (Oral) Specific strength unknown - Active. Medications Reconciled  Social History  Alcohol use  Occasional alcohol use. Caffeine use  Carbonated beverages, Tea. No drug use  Tobacco use  Never smoker.  Family History  Thyroid problems  Mother.  Pregnancy / Birth History  Age at menarche  14 years. Contraceptive History  Oral contraceptives. Gravida  0 Para  0 Regular periods   Other Problems  Back Pain  Cholelithiasis  Congestive Heart Failure  Diabetes Mellitus  Sleep Apnea  Thyroid Disease     Review of Systems General Present- Fatigue. Not Present- Appetite Loss, Chills, Fever, Night Sweats, Weight Gain and Weight Loss. Skin Not Present- Change in Wart/Mole, Dryness, Hives, Jaundice, New Lesions, Non-Healing Wounds, Rash and Ulcer. HEENT Present- Seasonal Allergies. Not Present- Earache, Hearing Loss, Hoarseness, Nose Bleed, Oral Ulcers, Ringing in the Ears, Sinus Pain, Sore Throat, Visual Disturbances, Wears glasses/contact lenses and Yellow Eyes. Cardiovascular Present- Leg Cramps. Not Present- Chest Pain, Difficulty Breathing Lying Down, Palpitations, Rapid Heart Rate, Shortness of Breath and Swelling of Extremities. Gastrointestinal Present- Abdominal Pain. Not Present- Bloating, Bloody Stool, Change in Bowel Habits, Chronic diarrhea, Constipation, Difficulty Swallowing, Excessive gas, Gets full quickly at meals, Hemorrhoids, Indigestion, Nausea, Rectal Pain and Vomiting. Female Genitourinary Not Present- Frequency, Nocturia, Painful Urination, Pelvic Pain and Urgency. Musculoskeletal Present- Back Pain and Joint Stiffness. Not Present- Joint Pain, Muscle Pain, Muscle Weakness and Swelling of Extremities. Neurological Present- Headaches. Not Present- Decreased Memory, Fainting, Numbness, Seizures, Tingling, Tremor, Trouble walking and  Weakness. Psychiatric Not Present- Anxiety, Bipolar, Change in Sleep Pattern, Depression, Fearful and Frequent crying. Endocrine Present- Hot flashes. Not Present- Cold Intolerance, Excessive Hunger, Hair Changes, Heat Intolerance and New Diabetes. Hematology Not Present-  Blood Thinners, Easy Bruising, Excessive bleeding, Gland problems, HIV and Persistent Infections.  Vitals Weight: 177.4 lb Height: 63in Body Surface Area: 1.84 m Body Mass Index: 31.42 kg/m  Temp.: 97.88F  Pulse: 98 (Regular)  BP: 126/84(Sitting, Left Arm, Standard)  Physical Exam  The physical exam findings are as follows: Note: Gen: alert and well appearing Eye: extraocular motion intact, no scleral icterus ENT: moist mucus membranes, dentition intact Neck: no mass or thyromegaly Chest: unlabored respirations, symmetrical air entry, clear bilaterally CV: regular rate and rhythm, no pedal edema Abdomen: Obese, soft, nontender, nondistended. No mass or organomegaly MSK: strength symmetrical throughout, no deformity Neuro: grossly intact, normal gait Psych: normal mood and affect, appropriate insight Skin: warm and dry, no rash or lesion on limited exam    Assessment & Plan   CHOLELITHIASIS (K80.20) Story: Given the location, her symptoms may be attributable to gallstones, however given the fact that it is not related to eating and that she also has similar pain on the left lower rib cage, I discussed with her that there is a small chance her symptoms would not resolve with laparoscopic cholecystectomy. Discussed laparoscopic cholecystectomy technique and risks of surgery including bleeding, pain, scarring, intraabdominal injury specifically to the common bile duct and sequelae, bile leak, conversion to open surgery, failure to resolve symptoms, blood clots/ pulmonary embolus, heart attack, pneumonia, stroke, death. Discussed possible sequela of gallstones including biliary colic, cholecystitis,  cholangitis, pancreatitis and we discussed signs and symptoms that should prompt her to seek urgent treatment. Discussed that a low-fat diet is the best bet for her to avoid developing worsening symptoms from her gallstones. Questions welcomed and answered to patient's satisfaction. We had a long discussion today and at this point she wishes to avoid surgery. She will call if her symptoms are worsening.  Update 02/11/19: Patient with persistent and worsening symptoms, mostly at night, desires to proceed with surgery.  Has been evaluated by Dr. Aundra Dubin in cardiology who recommended that she can proceed with surgery with recommendation the procedure be done at [facility] patient followed closely through the surgery and postop period.

## 2019-02-17 ENCOUNTER — Other Ambulatory Visit (HOSPITAL_COMMUNITY): Payer: Self-pay | Admitting: Cardiology

## 2019-02-23 ENCOUNTER — Encounter (HOSPITAL_COMMUNITY): Payer: Self-pay

## 2019-02-26 NOTE — Telephone Encounter (Signed)
Advanced Heart Failure Patient Advocate Encounter   Patient was approved to receive Corlanor from [facility]  Patient ID: [id number] Effective dates: 02/25/2019 through 05/13/2019.   [employer] patient assistance phone number for follow up is [phone].   Fax: [phone]   Called patient who seemed pleased with outcome, provided her the phone number to [facility] to call and schedule a shipment. Patient is aware that we will re-enroll next year as well.  Will route this to provider to update.   Charlann Boxer, CPhT

## 2019-03-13 ENCOUNTER — Other Ambulatory Visit: Payer: Self-pay | Admitting: Medical

## 2019-03-14 ENCOUNTER — Encounter (HOSPITAL_COMMUNITY): Payer: Self-pay

## 2019-03-15 ENCOUNTER — Other Ambulatory Visit (HOSPITAL_COMMUNITY): Payer: Self-pay | Admitting: *Deleted

## 2019-03-16 ENCOUNTER — Encounter (HOSPITAL_COMMUNITY)
Admission: RE | Admit: 2019-03-16 | Discharge: 2019-03-16 | Disposition: A | Discharge status: Home / Self Care | Payer: Commercial Managed Care - PPO | Source: Ambulatory Visit | Attending: Surgery | Admitting: Surgery

## 2019-03-16 ENCOUNTER — Encounter (HOSPITAL_COMMUNITY): Payer: Self-pay

## 2019-03-16 ENCOUNTER — Other Ambulatory Visit: Payer: Self-pay

## 2019-03-16 DIAGNOSIS — Z01818 Encounter for other preprocedural examination: Secondary | ICD-10-CM | POA: Insufficient documentation

## 2019-03-16 HISTORY — DX: Hypothyroidism, unspecified: E03.9

## 2019-03-16 HISTORY — DX: Anxiety disorder, unspecified: F41.9

## 2019-03-16 LAB — BASIC METABOLIC PANEL
Anion gap: 13 (ref 5–15)
BUN: 8 mg/dL (ref 6–20)
CO2: 25 mmol/L (ref 22–32)
Calcium: 9.7 mg/dL (ref 8.9–10.3)
Chloride: 102 mmol/L (ref 98–111)
Creatinine, Ser: 0.58 mg/dL (ref 0.44–1.00)
GFR calc Af Amer: >60 mL/min (ref >60)
GFR calc non Af Amer: >60 mL/min (ref >60)
Glucose, Bld: 155 mg/dL — ABNORMAL HIGH (ref 70–99)
Potassium: 4.5 mmol/L (ref 3.5–5.1)
Sodium: 140 mmol/L (ref 135–145)

## 2019-03-16 LAB — CBC
HCT: 45.6 % (ref 36.0–46.0)
Hemoglobin: 15.5 g/dL — ABNORMAL HIGH (ref 12.0–15.0)
MCH: 30 pg (ref 26.0–34.0)
MCHC: 34 g/dL (ref 30.0–36.0)
MCV: 88.4 fL (ref 80.0–100.0)
Platelets: 454 10*3/uL — ABNORMAL HIGH (ref 150–400)
RBC: 5.16 MIL/uL — ABNORMAL HIGH (ref 3.87–5.11)
RDW: 12.2 % (ref 11.5–15.5)
WBC: 6.2 10*3/uL (ref 4.0–10.5)
nRBC: 0 % (ref 0.0–0.2)

## 2019-03-16 LAB — HEMOGLOBIN A1C
Hgb A1c MFr Bld: 7.4 % — ABNORMAL HIGH (ref 4.8–5.6)
Mean Plasma Glucose: 165.68 mg/dL

## 2019-03-16 LAB — GLUCOSE, CAPILLARY: Glucose-Capillary: 175 mg/dL — ABNORMAL HIGH (ref 70–99)

## 2019-03-16 NOTE — Pre-Procedure Instructions (Addendum)
Lydia Taylor  03/16/2019     Your procedure is scheduled on ATuesday, November 10  Report to [facility], Main Entrance or Entrance "A" at 9:15 AM                         Your surgery or procedure is scheduled for 11:45 A.M.   Call this number if you have problems the morning of surgery:[phone]  This is the number for the Pre- Surgical Desk.                  For any other questions, please call [phone], Monday - Friday 8 AM - 4 PM.  Remember:  Do not eat or drink after midnight, Monday, November 9   Take these medicines the morning of surgery with A SIP OF WATER :               bisoprolol (ZEBETA)              lamoTRIgine (LAMICTAL)             levothyroxine (SYNTHROID, LEVOTHROID)                          finasteride (PROSCAR)             ivabradine (CORLANOR)  May take Clonazepam if needed  1 Week prior to surgery STOP taking Aspirin, Aspirin Products (Goody Powder, Excedrin Migraine), Ibuprofen (Advil), Naproxen (Aleve), Vitamins and Herbal Products (ie Fish Oil).   WHAT DO I DO ABOUT MY DIABETES MEDICATION?  >>>>>Do Not take Jaradiance Monday or  Tuesday <<<<<             Do Not Take Metformin or glimepiride (AMARYL)  Tuesday Morning (the morning of surgery)  How to Manage Your Diabetes Before and After Surgery  Why is it important to control my blood sugar before and after surgery? . Improving blood sugar levels before and after surgery helps healing and can limit problems. . A way of improving blood sugar control is eating a healthy diet by: o  Eating less sugar and carbohydrates o  Increasing activity/exercise o  Talking with your doctor about reaching your blood sugar goals . High blood sugars (greater than 180 mg/dL) can raise your risk of infections and slow your recovery, so you will need to focus on controlling your diabetes during the weeks before surgery. . Make sure that the doctor who takes care of your diabetes knows about your  planned surgery including the date and location.  How do I manage my blood sugar before surgery? . Check your blood sugar at least 4 times a day, starting 2 days before surgery, to make sure that the level is not too high or low. o Check your blood sugar the morning of your surgery when you wake up and every 2 hours until you get to the Short Stay unit. . If your blood sugar is less than 70 mg/dL, you will need to treat for low blood sugar: o Do not take insulin. o Treat a low blood sugar (less than 70 mg/dL) with  cup of clear juice (cranberry or apple), 4 glucose tablets, OR glucose gel. Recheck blood sugar in 15 minutes after treatment (to make sure it is greater than 70 mg/dL). If your blood sugar is not greater than 70 mg/dL on recheck, call [phone] o  for further instructions. . Report your blood  sugar to the short stay nurse when you get to Short Stay.  . If you are admitted to the hospital after surgery: o Your blood sugar will be checked by the staff and you will probably be given insulin after surgery (instead of oral diabetes medicines) to make sure you have good blood sugar levels. o The goal for blood sugar control after surgery is 80-180 mg/dL.   [place]- Preparing For Surgery  Before surgery, you can play an important role. Because skin is not sterile, your skin needs to be as free of germs as possible. You can reduce the number of germs on your skin by washing with CHG (chlorahexidine gluconate) Soap before surgery.  CHG is an antiseptic cleaner which kills germs and bonds with the skin to continue killing germs even after washing.    Oral Hygiene is also important to reduce your risk of infection.  Remember - BRUSH YOUR TEETH THE MORNING OF SURGERY WITH YOUR REGULAR TOOTHPASTE  Please do not use if you have an allergy to CHG or antibacterial soaps. If your skin becomes reddened/irritated stop using the CHG.  Do not shave (including legs and underarms) for at least  48 hours prior to first CHG shower. It is OK to shave your face.  Please follow these instructions carefully.   1. Shower the NIGHT BEFORE SURGERY and the MORNING OF SURGERY with CHG.   2. If you chose to wash your hair, wash your hair first as usual with your normal shampoo.  3. After you shampoo, wash your face and private area with the soap you use at home, then rinse your hair and body thoroughly to remove the shampoo and soap.rinse your hair and body thoroughly to remove the shampoo.  4. Use CHG as you would any other liquid soap. You can apply CHG directly to the skin and wash gently with a scrungie or a clean washcloth.   5. Apply the CHG Soap to your body ONLY FROM THE NECK DOWN.  Do not use on open wounds or open sores. Avoid contact with your eyes, ears, mouth and genitals (private parts).   6. Wash thoroughly, paying special attention to the area where your surgery will be performed.  7. Thoroughly rinse your body with warm water from the neck down.  8. DO NOT shower/wash with your normal soap after using and rinsing off the CHG Soap.  9. Pat yourself dry with a CLEAN TOWEL.  10. Wear CLEAN PAJAMAS to bed the night before surgery, wear comfortable clothes the morning of surgery  11. Place CLEAN SHEETS on your bed the night of your first shower and DO NOT SLEEP WITH PETS.  Day of Surgery: Shower as instructed above. Do not apply any deodorants/lotions, powders or colognes.  Please wear clean clothes to the hospital/surgery center.   Remember to brush your teeth WITH YOUR REGULAR TOOTHPASTE.  Do not ear jewelry, make-up or nail polish  Do not shave 48 hours prior to surgery.    Do not bring valuables to the hospital.  [facility] is not responsible for any belongings or valuables.  Contacts, dentures or bridgework may not be worn into surgery.  Leave your suitcase in the car.  After surgery it may be brought to your room.  For patients admitted to the hospital,  discharge time will be determined by your treatment team. Patients discharged the day of surgery will not be allowed to drive home.   Please read over the following fact sheets  that you were given: Pain Booklet, Coughing and Deep Breathing and Surgical Site Infections.

## 2019-03-16 NOTE — Progress Notes (Addendum)
PCP - Karis Juba, PA- [facility] [place]  Cardiologist - Dr Aundra Dubin  Endocrinologist Celso Amy, ANP [place] [facility] [address]  Chest x-ray - no  EKG - 03/16/2019  Stress Test - 2019  ECHO -   Cardiac Cath - no  Sleep Study - yes CPAP -   LABS-CBC, BMP, A1C  ASA- I verbally instructed patient o check with her surgeon.  ERAS-no  HA1C- Fasting Blood Sugar - 175 Checks Blood Sugar 2 times a week, husband has to help.  Anesthesia-  Pt denies having chest pain, sob, or fever at this time. All instructions explained to the pt, with a verbal understanding of the material. Pt agrees to go over the instructions while at home for a better understanding. Pt also instructed to self quarantine after being tested for COVID-19. The opportunity to ask questions was provided.

## 2019-03-17 NOTE — Anesthesia Preprocedure Evaluation (Addendum)
Anesthesia Evaluation  Patient identified by MRN, date of birth, ID band Patient awake    Reviewed: Allergy & Precautions, NPO status , Patient's Chart, lab work & pertinent test results  Airway Mallampati: II  TM Distance: >3 FB Neck ROM: Full    Dental no notable dental hx. (+) Teeth Intact, Dental Advisory Given   Pulmonary sleep apnea ,    Pulmonary exam normal breath sounds clear to auscultation       Cardiovascular Exercise Tolerance: Good hypertension, Pt. on home beta blockers and Pt. on medications +CHF  Normal cardiovascular exam Rhythm:Regular Rate:Normal  8/20 Echo Left Ventricle: The left ventricle has a visually estimated ejection fraction of 35. The cavity size was normal. There is no increase in left ventricular wall thickness. Left ventricular diastolic Doppler parameters are consistent with impaired  relaxation. Left ventricular diffuse hypokinesis  03/17/19 EKG SR R 67   Neuro/Psych Anxiety negative neurological ROS     GI/Hepatic negative GI ROS, Neg liver ROS,   Endo/Other  diabetes, Type 2Hypothyroidism   Renal/GU K+ 4.5 Cr 0.58     Musculoskeletal  (+) Fibromyalgia -  Abdominal   Peds  Hematology Hgb 15.5 Plts 454   Anesthesia Other Findings   Reproductive/Obstetrics                          Anesthesia Physical Anesthesia Plan  ASA: III  Anesthesia Plan: General   Post-op Pain Management:    Induction: Intravenous  PONV Risk Score and Plan: 3 and Treatment may vary due to age or medical condition, Ondansetron and Dexamethasone  Airway Management Planned: Oral ETT  Additional Equipment: None  Intra-op Plan:   Post-operative Plan: Extubation in OR  Informed Consent: I have reviewed the patients History and Physical, chart, labs and discussed the procedure including the risks, benefits and alternatives for the proposed anesthesia with the patient or  authorized representative who has indicated his/her understanding and acceptance.     Dental advisory given  Plan Discussed with:   Anesthesia Plan Comments: (PAT note written 03/17/2019 by Myra Gianotti, PA-C. )       Anesthesia Quick Evaluation

## 2019-03-17 NOTE — Progress Notes (Signed)
Anesthesia Chart Review:   Case: [id number] Date/Time: 03/23/19 1100   Procedure: LAPAROSCOPIC CHOLECYSTECTOMY (N/A )   Anesthesia type: General   Pre-op diagnosis: BILIARY COLIC   [facility] ROOM 06 / [facility] OR   Surgeon: Berna Bue, MD      DISCUSSION: Patient is a 43 year old female scheduled for the above procedure.  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  On 02/08/19, Dr. Shirlee Latch wrote, "It is in my medical opinion from a Cardiac standpoint that patient can have surgery (cholecystectomy) with the following recommendations: Surgery should be done at [facility] and will need to follow patient closely through the surgery and post-op period." (See Letters tab)   Presurgical COVID-19 test is scheduled for 03/19/19. If results negative and otherwise no acute changes then I would anticiapte that she can proceed as planned.    VS: BP 116/76   Pulse 74   Temp 36.6 C   Resp 18   Ht 5\' 3"  (1.6 m)   Wt 79.6 kg   LMP 03/08/2019   SpO2 99%   BMI 31.09 kg/m    PROVIDERS: 03/10/2019, PA-C is PCP  - Esperanza Richters, MD is HF cardiologist. Last seen 01/08/19. Cardiac MRI ordered and showed improvement in LVEF to 42%, no not referred for ICD. - 01/10/19, MD is EP cardiologist. Seen on 09/03/17. He did not think cardiomyopathy was the cause of her cardiomyopathy.  - 09/05/17, MD is primary cardiologist, although more recently followed by HF cardiology.    -Nanetta Batty, MD is cardiologist for Sleep Medicine. Per 09/03/18 note, "She underwent PSG showing mild OSA with an AHI of 6.4/hr and O2 desaturations as low as 85% and underwent CPAP titration to 13cm H2O.  She is doing well with his CPAP device.." -  Endocrinologist is with [facility]. Sees Younts, Meagan, ANP. (See Care Everywhere)   LABS: Labs reviewed: Acceptable for surgery. (all labs ordered are listed, but only abnormal results are displayed)  Labs Reviewed  GLUCOSE, CAPILLARY - Abnormal; Notable for the following components:      Result Value   Glucose-Capillary 175 (*)    All other components within normal limits  BASIC METABOLIC PANEL - Abnormal; Notable for the following components:   Glucose, Bld 155 (*)    All other components within normal limits  CBC - Abnormal; Notable for the following components:   RBC 5.16 (*)    Hemoglobin 15.5 (*)    Platelets 454 (*)    All other components within normal limits  HEMOGLOBIN A1C - Abnormal; Notable for the following components:   Hgb A1c MFr Bld 7.4 (*)    All other components within normal limits     IMAGES: Abdominal [facility] 01/15/19: IMPRESSION: - Cholelithiasis. Thickening of the gallbladder wall which could be seen with acute or chronic cholecystitis. However, the patient had a negative sonographic Murphy's sign. - Hepatic steatosis.   EKG: 03/16/19:  Normal sinus rhythm Minimal voltage criteria for LVH, may be normal variant ( R in aVL ) Inferior infarct , age undetermined Abnormal ECG - Anterolateral ST abnormality new/more pronounced in precordial leads when compared to 01/07/18 tracing.    CV: MRI Cardiac 02/09/19: IMPRESSION: 1.  Normal LV size with EF 42%, mild-moderate diffuse hypokinesis. 2.  Normal RV size and systolic function, EF 85%. 3. No myocardial LGE, so no definitive evidence for prior MI, myocarditis, or infiltrative disease. (Comparison 01/07/18: LVEF 20%, diffuse hypokinesis. Normal RVF, RVEF 60%, no evidence of infraction, infiltrative disease, or myocarditis)   Echo 01/08/19: IMPRESSIONS  1. The left ventricle has a visually estimated ejection fraction of 35%. The cavity size was normal. Left ventricular diastolic Doppler parameters are  consistent with impaired relaxation. Left ventricular diffuse hypokinesis.  2. The right ventricle has normal systolic function. The cavity was normal. There is no increase in right ventricular wall thickness.  3. No evidence of mitral valve stenosis. No significant mitral regurgitation.  4. The aortic valve is tricuspid. No stenosis of the aortic valve.  5. The aorta is normal unless otherwise noted.  6. The aortic root is normal in size and structure.  7. The IVC was normal in size. No complete TR doppler jet so unable to estimate PA systolic pressure. (Comparison EF 30%, diffuse LV hypokinesis, grade 1 diastolic dysfunction 06/19/72; LVEF 12-87%, grade 3 diastolic dysfunction 8/67/67)   CPX 02/09/18: Pre-exercise spirometry was within normal limits. The MVV was normal.  Conclusion: The interpretation of this test is limited due to submaximal effort during the exercise. Based on available data, exercise testing with gas exchange demonstrates low-normal functional capacity when compared to matched sedentary norms. At peak exercise, patient is nearing ventilatory limits and appears to be in a hyperventilatory state with moderate HF limitation. Although PVO2 is within a (low) normal range, VE/VCO2 slope is significantly elevated and indicative of worsening short-term prognosis.   CT coronary 09/25/17: IMPRESSION: 1. Calcium score 0 2. Normal right dominant coronary arteries suggesting patient has evere non ischemic cardiomyopathy 3.  Normal aortic root 3.0 cm   48 Hour Holter Monitor 09/22/17: 1. SR/ SB  2. Occ isolated PVCs   Past Medical History:  Diagnosis Date  . Anxiety    occasional panic atack  . CHF (congestive heart failure) ([place])   . Chicken pox   . Diabetes mellitus without complication ([place])   . DM (diabetes mellitus), type 2 ([place])   . Fibromyalgia   . Hypothyroidism   . Mood disorder ([place])   . OSA on CPAP 09/02/2018    mild OSA with an AHI of 6.4/hr and O2  desaturations as low as 85% and underwent CPAP titration to 13cm H2O.  Marland Kitchen PCOS (polycystic ovarian syndrome)   . Sinus tachycardia     Past Surgical History:  Procedure Laterality Date  . BREAST REDUCTION SURGERY    . WISDOM TOOTH EXTRACTION      MEDICATIONS: . Apple Cider Vinegar 500 MG TABS  . Ascorbic Acid (VITAMIN C) 1000 MG tablet  . atorvastatin (LIPITOR) 40 MG tablet  . bisoprolol (ZEBETA) 10 MG tablet  . cholecalciferol (VITAMIN D3) 25 MCG (1000 UT) tablet  . clonazePAM (KLONOPIN) 0.5 MG tablet  . empagliflozin (JARDIANCE) 25 MG TABS tablet  . ENTRESTO 49-51 MG  . finasteride (PROSCAR) 5 MG tablet  . furosemide (LASIX) 20 MG tablet  . glimepiride (AMARYL) 1 MG tablet  . ivabradine (CORLANOR) 5 MG TABS tablet  . KRILL OIL PO  . lamoTRIgine (LAMICTAL) 25 MG tablet  . levothyroxine (SYNTHROID, LEVOTHROID) 25 MCG tablet  . metFORMIN (GLUCOPHAGE) 1000 MG tablet  . Multiple Vitamins-Minerals (HAIR SKIN AND NAILS FORMULA PO)  . norethindrone (MICRONOR,CAMILA,ERRIN) 0.35 MG tablet  . Semaglutide, 1 MG/DOSE, (OZEMPIC, 1 MG/DOSE,) 2 MG/1.5ML SOPN  . spironolactone (ALDACTONE) 25 MG tablet  .  Turmeric 500 MG CAPS  . vitamin B-12 (CYANOCOBALAMIN) 1000 MCG tablet   No current facility-administered medications for this encounter.      Shonna Chock, PA-C Surgical Short Stay/Anesthesiology [facility] Phone [phone] [facility] Phone [phone] 03/17/2019 3:09 PM

## 2019-03-18 ENCOUNTER — Other Ambulatory Visit (HOSPITAL_COMMUNITY): Payer: Self-pay | Admitting: *Deleted

## 2019-03-18 MED ORDER — FUROSEMIDE 20 MG PO TABS: 60.0000 mg | ORAL_TABLET | Freq: Every day | ORAL | 3 refills | Status: DC

## 2019-03-19 ENCOUNTER — Other Ambulatory Visit (HOSPITAL_COMMUNITY)
Admission: RE | Admit: 2019-03-19 | Discharge: 2019-03-19 | Disposition: A | Discharge status: Home / Self Care | Payer: Commercial Managed Care - PPO | Source: Ambulatory Visit | Attending: Surgery | Admitting: Surgery

## 2019-03-19 DIAGNOSIS — Z01812 Encounter for preprocedural laboratory examination: Secondary | ICD-10-CM | POA: Insufficient documentation

## 2019-03-19 DIAGNOSIS — Z20828 Contact with and (suspected) exposure to other viral communicable diseases: Secondary | ICD-10-CM | POA: Diagnosis not present

## 2019-03-20 LAB — NOVEL CORONAVIRUS, NAA (HOSP ORDER, SEND-OUT TO REF LAB; TAT 18-24 HRS): SARS-CoV-2, NAA: NOT DETECTED

## 2019-03-22 ENCOUNTER — Other Ambulatory Visit: Payer: Self-pay

## 2019-03-22 ENCOUNTER — Encounter (HOSPITAL_COMMUNITY): Payer: Self-pay | Admitting: *Deleted

## 2019-03-22 MED ORDER — BUPIVACAINE LIPOSOME 1.3 % IJ SUSP
20.0000 mL | Freq: Once | INTRAMUSCULAR | Status: DC
  Filled 2019-03-22 (×2): qty 20

## 2019-03-23 ENCOUNTER — Ambulatory Visit (HOSPITAL_COMMUNITY): Payer: Commercial Managed Care - PPO | Admitting: Vascular Surgery

## 2019-03-23 ENCOUNTER — Ambulatory Visit (HOSPITAL_COMMUNITY): Payer: Commercial Managed Care - PPO | Admitting: Anesthesiology

## 2019-03-23 ENCOUNTER — Ambulatory Visit (HOSPITAL_COMMUNITY)
Admission: RE | Admit: 2019-03-23 | Discharge: 2019-03-23 | Disposition: A | Discharge status: Home / Self Care | Payer: Commercial Managed Care - PPO | Attending: Surgery | Admitting: Surgery

## 2019-03-23 ENCOUNTER — Encounter (HOSPITAL_COMMUNITY)
Admission: RE | Disposition: A | Discharge status: Home / Self Care | Payer: Self-pay | Source: Home / Self Care | Attending: Surgery

## 2019-03-23 ENCOUNTER — Encounter (HOSPITAL_COMMUNITY): Payer: Self-pay | Admitting: *Deleted

## 2019-03-23 ENCOUNTER — Other Ambulatory Visit: Payer: Self-pay

## 2019-03-23 DIAGNOSIS — G4733 Obstructive sleep apnea (adult) (pediatric): Secondary | ICD-10-CM | POA: Diagnosis not present

## 2019-03-23 DIAGNOSIS — Z8349 Family history of other endocrine, nutritional and metabolic diseases: Secondary | ICD-10-CM | POA: Insufficient documentation

## 2019-03-23 DIAGNOSIS — M797 Fibromyalgia: Secondary | ICD-10-CM | POA: Insufficient documentation

## 2019-03-23 DIAGNOSIS — E119 Type 2 diabetes mellitus without complications: Secondary | ICD-10-CM | POA: Diagnosis not present

## 2019-03-23 DIAGNOSIS — E282 Polycystic ovarian syndrome: Secondary | ICD-10-CM | POA: Insufficient documentation

## 2019-03-23 DIAGNOSIS — Z79899 Other long term (current) drug therapy: Secondary | ICD-10-CM | POA: Diagnosis not present

## 2019-03-23 DIAGNOSIS — Z7984 Long term (current) use of oral hypoglycemic drugs: Secondary | ICD-10-CM | POA: Insufficient documentation

## 2019-03-23 DIAGNOSIS — I509 Heart failure, unspecified: Secondary | ICD-10-CM | POA: Diagnosis not present

## 2019-03-23 DIAGNOSIS — K8044 Calculus of bile duct with chronic cholecystitis without obstruction: Secondary | ICD-10-CM | POA: Diagnosis present

## 2019-03-23 DIAGNOSIS — E079 Disorder of thyroid, unspecified: Secondary | ICD-10-CM | POA: Diagnosis not present

## 2019-03-23 DIAGNOSIS — K828 Other specified diseases of gallbladder: Secondary | ICD-10-CM | POA: Insufficient documentation

## 2019-03-23 DIAGNOSIS — Z880 Allergy status to penicillin: Secondary | ICD-10-CM | POA: Insufficient documentation

## 2019-03-23 DIAGNOSIS — R160 Hepatomegaly, not elsewhere classified: Secondary | ICD-10-CM | POA: Diagnosis not present

## 2019-03-23 DIAGNOSIS — I110 Hypertensive heart disease with heart failure: Secondary | ICD-10-CM | POA: Insufficient documentation

## 2019-03-23 DIAGNOSIS — K8010 Calculus of gallbladder with chronic cholecystitis without obstruction: Secondary | ICD-10-CM | POA: Diagnosis not present

## 2019-03-23 HISTORY — PX: CHOLECYSTECTOMY: SHX55

## 2019-03-23 LAB — GLUCOSE, CAPILLARY
Glucose-Capillary: 195 mg/dL — ABNORMAL HIGH (ref 70–99)
Glucose-Capillary: 203 mg/dL — ABNORMAL HIGH (ref 70–99)

## 2019-03-23 LAB — PREGNANCY, URINE: Preg Test, Ur: NEGATIVE

## 2019-03-23 SURGERY — LAPAROSCOPIC CHOLECYSTECTOMY
Anesthesia: General | Site: Abdomen

## 2019-03-23 MED ORDER — ONDANSETRON HCL 4 MG/2ML IJ SOLN
INTRAMUSCULAR | Status: DC | PRN
  Administered 2019-03-23: 4 mg via INTRAVENOUS

## 2019-03-23 MED ORDER — CHLORHEXIDINE GLUCONATE 4 % EX LIQD
60.0000 mL | Freq: Once | CUTANEOUS | Status: AC
  Administered 2019-03-23: 4 via TOPICAL

## 2019-03-23 MED ORDER — SUGAMMADEX SODIUM 200 MG/2ML IV SOLN
INTRAVENOUS | Status: DC | PRN
  Administered 2019-03-23: 200 mg via INTRAVENOUS

## 2019-03-23 MED ORDER — PROPOFOL 10 MG/ML IV BOLUS
INTRAVENOUS | Status: DC | PRN
  Administered 2019-03-23: 120 mg via INTRAVENOUS

## 2019-03-23 MED ORDER — PROMETHAZINE HCL 25 MG/ML IJ SOLN: 6.2500 mg | INTRAMUSCULAR | Status: DC | PRN

## 2019-03-23 MED ORDER — DEXMEDETOMIDINE HCL IN NACL 200 MCG/50ML IV SOLN
INTRAVENOUS | Status: DC | PRN
  Administered 2019-03-23 (×4): 4 ug via INTRAVENOUS

## 2019-03-23 MED ORDER — ACETAMINOPHEN 500 MG PO TABS
1000.0000 mg | ORAL_TABLET | ORAL | Status: AC
  Administered 2019-03-23: 11:00:00 1000 mg via ORAL
  Filled 2019-03-23: qty 2

## 2019-03-23 MED ORDER — ROCURONIUM BROMIDE 10 MG/ML (PF) SYRINGE
PREFILLED_SYRINGE | INTRAVENOUS | Status: AC
  Filled 2019-03-23: qty 10

## 2019-03-23 MED ORDER — TRAMADOL HCL 50 MG PO TABS: 50.0000 mg | ORAL_TABLET | Freq: Four times a day (QID) | ORAL | 0 refills | Status: DC | PRN

## 2019-03-23 MED ORDER — DEXAMETHASONE SODIUM PHOSPHATE 10 MG/ML IJ SOLN
INTRAMUSCULAR | Status: DC | PRN
  Administered 2019-03-23: 10 mg via INTRAVENOUS

## 2019-03-23 MED ORDER — MIDAZOLAM HCL 2 MG/2ML IJ SOLN
INTRAMUSCULAR | Status: AC
  Filled 2019-03-23: qty 2

## 2019-03-23 MED ORDER — BUPIVACAINE HCL (PF) 0.25 % IJ SOLN
INTRAMUSCULAR | Status: AC
  Filled 2019-03-23: qty 30

## 2019-03-23 MED ORDER — DEXAMETHASONE SODIUM PHOSPHATE 10 MG/ML IJ SOLN
INTRAMUSCULAR | Status: AC
  Filled 2019-03-23: qty 1

## 2019-03-23 MED ORDER — HYDROMORPHONE HCL 1 MG/ML IJ SOLN: 0.2500 mg | INTRAMUSCULAR | Status: DC | PRN

## 2019-03-23 MED ORDER — FENTANYL CITRATE (PF) 100 MCG/2ML IJ SOLN: 25.0000 ug | INTRAMUSCULAR | Status: DC | PRN

## 2019-03-23 MED ORDER — FENTANYL CITRATE (PF) 250 MCG/5ML IJ SOLN
INTRAMUSCULAR | Status: AC
  Filled 2019-03-23: qty 5

## 2019-03-23 MED ORDER — CEFAZOLIN SODIUM-DEXTROSE 2-4 GM/100ML-% IV SOLN
2.0000 g | INTRAVENOUS | Status: AC
  Administered 2019-03-23: 2 g via INTRAVENOUS
  Filled 2019-03-23: qty 100

## 2019-03-23 MED ORDER — DEXMEDETOMIDINE HCL IN NACL 200 MCG/50ML IV SOLN
INTRAVENOUS | Status: AC
  Filled 2019-03-23: qty 50

## 2019-03-23 MED ORDER — MEPERIDINE HCL 50 MG/ML IJ SOLN: 6.2500 mg | INTRAMUSCULAR | Status: DC | PRN

## 2019-03-23 MED ORDER — BUPIVACAINE LIPOSOME 1.3 % IJ SUSP
INTRAMUSCULAR | Status: DC | PRN
  Administered 2019-03-23: 20 mL

## 2019-03-23 MED ORDER — BUPIVACAINE HCL (PF) 0.25 % IJ SOLN
INTRAMUSCULAR | Status: DC | PRN
  Administered 2019-03-23: 30 mL

## 2019-03-23 MED ORDER — 0.9 % SODIUM CHLORIDE (POUR BTL) OPTIME
TOPICAL | Status: DC | PRN
  Administered 2019-03-23: 13:00:00 1000 mL

## 2019-03-23 MED ORDER — FENTANYL CITRATE (PF) 250 MCG/5ML IJ SOLN
INTRAMUSCULAR | Status: DC | PRN
  Administered 2019-03-23: 50 ug via INTRAVENOUS
  Administered 2019-03-23: 100 ug via INTRAVENOUS

## 2019-03-23 MED ORDER — HYDROCODONE-ACETAMINOPHEN 7.5-325 MG PO TABS: 1.0000 | ORAL_TABLET | Freq: Once | ORAL | Status: DC | PRN

## 2019-03-23 MED ORDER — LACTATED RINGERS IV SOLN
INTRAVENOUS | Status: DC
  Administered 2019-03-23: 11:00:00 via INTRAVENOUS

## 2019-03-23 MED ORDER — SUCCINYLCHOLINE CHLORIDE 20 MG/ML IJ SOLN
INTRAMUSCULAR | Status: DC | PRN
  Administered 2019-03-23: 90 mg via INTRAVENOUS

## 2019-03-23 MED ORDER — ONDANSETRON HCL 4 MG/2ML IJ SOLN
INTRAMUSCULAR | Status: AC
  Filled 2019-03-23: qty 2

## 2019-03-23 MED ORDER — ACETAMINOPHEN 325 MG PO TABS: 650.0000 mg | ORAL_TABLET | ORAL | Status: DC | PRN

## 2019-03-23 MED ORDER — ROCURONIUM BROMIDE 100 MG/10ML IV SOLN
INTRAVENOUS | Status: DC | PRN
  Administered 2019-03-23: 10 mg via INTRAVENOUS
  Administered 2019-03-23: 40 mg via INTRAVENOUS

## 2019-03-23 MED ORDER — GABAPENTIN 300 MG PO CAPS
300.0000 mg | ORAL_CAPSULE | ORAL | Status: AC
  Administered 2019-03-23: 11:00:00 300 mg via ORAL
  Filled 2019-03-23: qty 1

## 2019-03-23 MED ORDER — DOCUSATE SODIUM 100 MG PO CAPS: 100.0000 mg | ORAL_CAPSULE | Freq: Two times a day (BID) | ORAL | 0 refills | Status: AC

## 2019-03-23 MED ORDER — PROPOFOL 10 MG/ML IV BOLUS
INTRAVENOUS | Status: AC
  Filled 2019-03-23: qty 20

## 2019-03-23 MED ORDER — SODIUM CHLORIDE 0.9% FLUSH: 3.0000 mL | Freq: Two times a day (BID) | INTRAVENOUS | Status: DC

## 2019-03-23 MED ORDER — ACETAMINOPHEN 650 MG RE SUPP
650.0000 mg | RECTAL | Status: DC | PRN
  Filled 2019-03-23: qty 1

## 2019-03-23 MED ORDER — SODIUM CHLORIDE 0.9 % IV SOLN: 250.0000 mL | INTRAVENOUS | Status: DC | PRN

## 2019-03-23 MED ORDER — MIDAZOLAM HCL 5 MG/5ML IJ SOLN
INTRAMUSCULAR | Status: DC | PRN
  Administered 2019-03-23: 2 mg via INTRAVENOUS

## 2019-03-23 MED ORDER — SODIUM CHLORIDE 0.9% FLUSH: 3.0000 mL | INTRAVENOUS | Status: DC | PRN

## 2019-03-23 MED ORDER — KETOROLAC TROMETHAMINE 30 MG/ML IJ SOLN: 30.0000 mg | Freq: Once | INTRAMUSCULAR | Status: DC | PRN

## 2019-03-23 MED ORDER — OXYCODONE HCL 5 MG PO TABS
ORAL_TABLET | ORAL | Status: AC
  Filled 2019-03-23: qty 1

## 2019-03-23 MED ORDER — LACTATED RINGERS IR SOLN
Status: DC | PRN
  Administered 2019-03-23: 1000 mL

## 2019-03-23 MED ORDER — BUPIVACAINE-EPINEPHRINE 0.25% -1:200000 IJ SOLN
INTRAMUSCULAR | Status: AC
  Filled 2019-03-23: qty 1

## 2019-03-23 MED ORDER — LIDOCAINE HCL (CARDIAC) PF 100 MG/5ML IV SOSY
PREFILLED_SYRINGE | INTRAVENOUS | Status: DC | PRN
  Administered 2019-03-23: 100 mg via INTRAVENOUS

## 2019-03-23 MED ORDER — OXYCODONE HCL 5 MG PO TABS
5.0000 mg | ORAL_TABLET | ORAL | Status: DC | PRN
  Administered 2019-03-23: 5 mg via ORAL

## 2019-03-23 SURGICAL SUPPLY — 46 items
ADH SKN CLS APL DERMABOND .7 (GAUZE/BANDAGES/DRESSINGS) ×1
APL PRP STRL LF DISP 70% ISPRP (MISCELLANEOUS) ×1
APPLIER CLIP ROT 10 11.4 M/L (STAPLE) ×3
APR CLP MED LRG 11.4X10 (STAPLE) ×1
BAG SPEC RTRVL 10 TROC 200 (ENDOMECHANICALS) ×1
BAG SPEC RTRVL LRG 6X4 10 (ENDOMECHANICALS)
CABLE HIGH FREQUENCY MONO STRZ (ELECTRODE) ×3 IMPLANT
CHLORAPREP W/TINT 26 (MISCELLANEOUS) ×3 IMPLANT
CLIP APPLIE ROT 10 11.4 M/L (STAPLE) ×1 IMPLANT
COVER MAYO STAND STRL (DRAPES) IMPLANT
COVER SURGICAL LIGHT HANDLE (MISCELLANEOUS) ×3 IMPLANT
COVER WAND RF STERILE (DRAPES) IMPLANT
DECANTER SPIKE VIAL GLASS SM (MISCELLANEOUS) ×3 IMPLANT
DERMABOND ADVANCED (GAUZE/BANDAGES/DRESSINGS) ×2
DERMABOND ADVANCED .7 DNX12 (GAUZE/BANDAGES/DRESSINGS) ×1 IMPLANT
DRAPE C-ARM 42X120 X-RAY (DRAPES) IMPLANT
ELECT REM PT RETURN 15FT ADLT (MISCELLANEOUS) ×3 IMPLANT
GLOVE BIO SURGEON STRL SZ 6 (GLOVE) ×3 IMPLANT
GLOVE BIOGEL PI IND STRL 7.0 (GLOVE) IMPLANT
GLOVE BIOGEL PI INDICATOR 7.0 (GLOVE) ×6
GLOVE ECLIPSE 6.5 STRL STRAW (GLOVE) ×2 IMPLANT
GLOVE INDICATOR 6.5 STRL GRN (GLOVE) ×3 IMPLANT
GLOVE SURG SS PI 7.0 STRL IVOR (GLOVE) ×2 IMPLANT
GOWN STRL REUS W/TWL LRG LVL3 (GOWN DISPOSABLE) ×3 IMPLANT
GOWN STRL REUS W/TWL XL LVL3 (GOWN DISPOSABLE) ×6 IMPLANT
GRASPER SUT TROCAR 14GX15 (MISCELLANEOUS) ×3 IMPLANT
HEMOSTAT SNOW SURGICEL 2X4 (HEMOSTASIS) ×2 IMPLANT
KIT BASIN OR (CUSTOM PROCEDURE TRAY) ×3 IMPLANT
KIT TURNOVER KIT A (KITS) IMPLANT
NDL INSUFFLATION 14GA 120MM (NEEDLE) ×1 IMPLANT
NEEDLE INSUFFLATION 14GA 120MM (NEEDLE) ×3 IMPLANT
POUCH RETRIEVAL ECOSAC 10 (ENDOMECHANICALS) IMPLANT
POUCH RETRIEVAL ECOSAC 10MM (ENDOMECHANICALS) ×2
POUCH SPECIMEN RETRIEVAL 10MM (ENDOMECHANICALS) ×1 IMPLANT
SCISSORS LAP 5X35 DISP (ENDOMECHANICALS) ×3 IMPLANT
SET CHOLANGIOGRAPH MIX (MISCELLANEOUS) IMPLANT
SET IRRIG TUBING LAPAROSCOPIC (IRRIGATION / IRRIGATOR) ×3 IMPLANT
SET TUBE SMOKE EVAC HIGH FLOW (TUBING) ×3 IMPLANT
SLEEVE XCEL OPT CAN 5 100 (ENDOMECHANICALS) ×6 IMPLANT
SUT MNCRL AB 4-0 PS2 18 (SUTURE) ×3 IMPLANT
SYR 10ML LL (SYRINGE) ×2 IMPLANT
TOWEL OR 17X26 10 PK STRL BLUE (TOWEL DISPOSABLE) ×3 IMPLANT
TOWEL OR NON WOVEN STRL DISP B (DISPOSABLE) IMPLANT
TRAY LAPAROSCOPIC (CUSTOM PROCEDURE TRAY) ×3 IMPLANT
TROCAR BLADELESS OPT 5 100 (ENDOMECHANICALS) ×3 IMPLANT
TROCAR XCEL 12X100 BLDLESS (ENDOMECHANICALS) ×3 IMPLANT

## 2019-03-23 NOTE — H&P (Signed)
Lydia Taylor DOB: 01/15/1976   History of Present Illness Patient words: This is a very pleasant 42-year-old woman is referred for right upper quadrant pain and recent ultrasound finding of cholelithiasis.  She states that she has been having intermittent mild right lower anterior rib cage pain for several months. It seems to be somewhat random, is not brought on by any specific activities and is not associated with eating. Has A few times. She does note issues with nausea and has had just a couple episodes of emesis. Denies any fevers, jaundice, or other abdominal pain. Overall she considers her symptoms to be mild. She has been trying to be more conscientious about not eating greasy or fatty foods recently.  Ultrasound performed 01/15/19 shows multiple gallstones, slight thickening of the gallbladder wall, negative Murphy sign. Common bile duct 5.2 mm. Increased liver echogenicity suggestive of hepatic steatosis.  Her history is notable for congestive heart failure with an ejection fraction of 35%. She states that this is thought to be secondary to some viral infection. Also has fibromyalgia, obstructive sleep apnea on CPAP, diabetes, polycystic ovary syndrome. Denies any prior abdominal surgeries. No tobacco abuse.   Past Surgical History Mammoplasty; Reduction  Bilateral. Oral Surgery   Diagnostic Studies History  Colonoscopy  never Mammogram  within last year Pap Smear  1-5 years ago  Allergies Amoxicillin *PENICILLINS*    Medication History  Bisoprolol Fumarate (10MG Tablet, Oral) Active. Entresto (49-51MG Tablet, Oral) Active. Jardiance (25MG Tablet, Oral) Active. lamoTRIgine (25MG Tablet, Oral) Active. Furosemide (40MG Tablet, Oral) Active. Glimepiride (1MG Tablet, Oral) Active. Jencycla (0.35MG Tablet, Oral) Active. metFORMIN HCl ER (500MG Tablet ER 24HR, Oral) Active. Levothyroxine Sodium (25MCG Tablet, Oral) Active. Ozempic (1 MG/DOSE)  (2MG/1.5ML Soln Pen-inj, Subcutaneous) Active. Spironolactone (25MG Tablet, Oral) Active. Lipitor (Oral) Specific strength unknown - Active. Medications Reconciled  Social History  Alcohol use  Occasional alcohol use. Caffeine use  Carbonated beverages, Tea. No drug use  Tobacco use  Never smoker.  Family History  Thyroid problems  Mother.  Pregnancy / Birth History  Age at menarche  14 years. Contraceptive History  Oral contraceptives. Gravida  0 Para  0 Regular periods   Other Problems  Back Pain  Cholelithiasis  Congestive Heart Failure  Diabetes Mellitus  Sleep Apnea  Thyroid Disease     Review of Systems General Present- Fatigue. Not Present- Appetite Loss, Chills, Fever, Night Sweats, Weight Gain and Weight Loss. Skin Not Present- Change in Wart/Mole, Dryness, Hives, Jaundice, New Lesions, Non-Healing Wounds, Rash and Ulcer. HEENT Present- Seasonal Allergies. Not Present- Earache, Hearing Loss, Hoarseness, Nose Bleed, Oral Ulcers, Ringing in the Ears, Sinus Pain, Sore Throat, Visual Disturbances, Wears glasses/contact lenses and Yellow Eyes. Cardiovascular Present- Leg Cramps. Not Present- Chest Pain, Difficulty Breathing Lying Down, Palpitations, Rapid Heart Rate, Shortness of Breath and Swelling of Extremities. Gastrointestinal Present- Abdominal Pain. Not Present- Bloating, Bloody Stool, Change in Bowel Habits, Chronic diarrhea, Constipation, Difficulty Swallowing, Excessive gas, Gets full quickly at meals, Hemorrhoids, Indigestion, Nausea, Rectal Pain and Vomiting. Female Genitourinary Not Present- Frequency, Nocturia, Painful Urination, Pelvic Pain and Urgency. Musculoskeletal Present- Back Pain and Joint Stiffness. Not Present- Joint Pain, Muscle Pain, Muscle Weakness and Swelling of Extremities. Neurological Present- Headaches. Not Present- Decreased Memory, Fainting, Numbness, Seizures, Tingling, Tremor, Trouble walking and  Weakness. Psychiatric Not Present- Anxiety, Bipolar, Change in Sleep Pattern, Depression, Fearful and Frequent crying. Endocrine Present- Hot flashes. Not Present- Cold Intolerance, Excessive Hunger, Hair Changes, Heat Intolerance and New Diabetes. Hematology Not Present-   Blood Thinners, Easy Bruising, Excessive bleeding, Gland problems, HIV and Persistent Infections.  Vitals Weight: 177.4 lb Height: 63in Body Surface Area: 1.84 m Body Mass Index: 31.42 kg/m  Temp.: 97.23F  Pulse: 98 (Regular)  BP: 126/84(Sitting, Left Arm, Standard)  Physical Exam  The physical exam findings are as follows: Note: Gen: alert and well appearing Eye: extraocular motion intact, no scleral icterus ENT: moist mucus membranes, dentition intact Neck: no mass or thyromegaly Chest: unlabored respirations, symmetrical air entry, clear bilaterally CV: regular rate and rhythm, no pedal edema Abdomen: Obese, soft, nontender, nondistended. No mass or organomegaly MSK: strength symmetrical throughout, no deformity Neuro: grossly intact, normal gait Psych: normal mood and affect, appropriate insight Skin: warm and dry, no rash or lesion on limited exam    Assessment & Plan   CHOLELITHIASIS (K80.20) Story: Given the location, her symptoms may be attributable to gallstones, however given the fact that it is not related to eating and that she also has similar pain on the left lower rib cage, I discussed with her that there is a small chance her symptoms would not resolve with laparoscopic cholecystectomy. Discussed laparoscopic cholecystectomy technique and risks of surgery including bleeding, pain, scarring, intraabdominal injury specifically to the common bile duct and sequelae, bile leak, conversion to open surgery, failure to resolve symptoms, blood clots/ pulmonary embolus, heart attack, pneumonia, stroke, death. Discussed possible sequela of gallstones including biliary colic, cholecystitis,  cholangitis, pancreatitis and we discussed signs and symptoms that should prompt her to seek urgent treatment. Discussed that a low-fat diet is the best bet for her to avoid developing worsening symptoms from her gallstones. Questions welcomed and answered to patient's satisfaction. We had a long discussion today and at this point she wishes to avoid surgery. She will call if her symptoms are worsening.  Update 02/11/19: Patient with persistent and worsening symptoms, mostly at night, desires to proceed with surgery.  Has been evaluated by Dr. Shirlee Latch in cardiology who recommended that she can proceed with surgery with recommendation the procedure be done at [facility] patient followed closely through the surgery and postop period.

## 2019-03-23 NOTE — Discharge Instructions (Signed)
LAPAROSCOPIC SURGERY: POST OP INSTRUCTIONS  ######################################################################  EAT Gradually transition to a high fiber diet with a fiber supplement over the next few weeks after discharge.  Start with a pureed / full liquid diet (see below)  WALK Walk an hour a day.  Control your pain to do that.    CONTROL PAIN Control pain so that you can walk, sleep, tolerate sneezing/coughing, go up/down stairs.  HAVE A BOWEL MOVEMENT DAILY Keep your bowels regular to avoid problems.  OK to try a laxative to override constipation.  OK to use an antidairrheal to slow down diarrhea.  Call if not better after 2 tries  CALL IF YOU HAVE PROBLEMS/CONCERNS Call if you are still struggling despite following these instructions. Call if you have concerns not answered by these instructions  ######################################################################    1. DIET: Follow a light bland diet & liquids the first 24 hours after arrival home, such as soup, liquids, starches, etc.  Be sure to drink plenty of fluids.  Quickly advance to a usual solid diet within a few days.  Avoid fast food or heavy meals as your are more likely to get nauseated or have irregular bowels.  A low-sugar, high-fiber diet for the rest of your life is ideal.  2. Take your usually prescribed home medications unless otherwise directed.  3. PAIN CONTROL: a. Pain is best controlled by a usual combination of three different methods TOGETHER: i. Ice/Heat ii. Over the counter pain medication iii. Prescription pain medication b. Most patients will experience some swelling and bruising around the incisions.  Ice packs or heating pads (30-60 minutes up to 6 times a day) will help. Use ice for the first few days to help decrease swelling and bruising, then switch to heat to help relax tight/sore spots and speed recovery.  Some people prefer to use ice alone, heat alone, alternating between ice & heat.   Experiment to what works for you.  Swelling and bruising can take several weeks to resolve.   c. It is helpful to take an over-the-counter pain medication regularly for the first few weeks.  Choose one of the following that works best for you: i. Naproxen (Aleve, etc)  Two 220mg  tabs twice a day ii. Ibuprofen (Advil, etc) Three 200mg  tabs four times a day (every meal & bedtime) iii. Acetaminophen (Tylenol, etc) 500-650mg  four times a day (every meal & bedtime) d. A  prescription for pain medication (such as oxycodone, hydrocodone, tramadol, gabapentin, methocarbamol, etc) should be given to you upon discharge.  Take your pain medication as prescribed.  i. If you are having problems/concerns with the prescription medicine (does not control pain, nausea, vomiting, rash, itching, etc), please call us [phone] to see if we need to switch you to a different pain medicine that will work better for you and/or control your side effect better. ii. If you need a refill on your pain medication, please give [place] 48 hour notice.  contact your pharmacy.  They will contact our office to request authorization. Prescriptions will not be filled after 5 pm or on week-ends  4. Avoid getting constipated.   a. Between the surgery and the pain medications, it is common to experience some constipation.   b. Increasing fluid intake and taking a fiber supplement (such as Metamucil, Citrucel, FiberCon, MiraLax, etc) 1-2 times a day regularly will usually help prevent this problem from occurring.   c. A mild laxative (prune juice, Milk of Magnesia, MiraLax, etc) should be taken according to  package directions if there are no bowel movements after 48 hours.   5. Watch out for diarrhea.   a. If you have many loose bowel movements, simplify your diet to bland foods & liquids for a few days.   b. Stop any stool softeners and decrease your fiber supplement.   c. Switching to mild anti-diarrheal medications (Kayopectate, Pepto  Bismol) can help.   d. If this worsens or does not improve, please call us.  6. Wash / shower every day.  You may shower over the skin glue which is waterproof.  Continue to shower over incision(s) after the dressing is off.  7. Glue will flake off after 2 weeks.  You may leave the incision open to air.  You may replace a dressing/Band-Aid to cover the incision for comfort if you wish.   8. ACTIVITIES as tolerated:   a. You may resume regular (light) daily activities beginning the next day--such as daily self-care, walking, climbing stairs--gradually increasing activities as tolerated.  If you can walk 30 minutes without difficulty, it is safe to try more intense activity such as jogging, treadmill, bicycling, low-impact aerobics, swimming, etc. b. Save the most intensive and strenuous activity for last such as sit-ups, heavy lifting, contact sports, etc  Refrain from any heavy lifting or straining until you are off narcotics for pain control.   c. DO NOT PUSH THROUGH PAIN.  Let pain be your guide: If it hurts to do something, don't do it.  Pain is your body warning you to avoid that activity for another week until the pain goes down. d. You may drive when you are no longer taking prescription pain medication, you can comfortably wear a seatbelt, and you can safely maneuver your car and apply brakes. e. Bonita Quin may have sexual intercourse when it is comfortable.  9. FOLLOW UP in our office a. Please call [facility] at [phone] to set up an appointment to see your surgeon in the office for a follow-up appointment approximately 2-3 weeks after your surgery. b. Make sure that you call for this appointment the day you arrive home to insure a convenient appointment time.  10. IF YOU HAVE DISABILITY OR FAMILY LEAVE FORMS, BRING THEM TO THE OFFICE FOR PROCESSING.  DO NOT GIVE THEM TO YOUR DOCTOR.   WHEN TO CALL us [phone]: 1. Poor pain control 2. Reactions / problems with new medications  (rash/itching, nausea, etc)  3. Fever over 101.5 F (38.5 C) 4. Inability to urinate 5. Nausea and/or vomiting 6. Worsening swelling or bruising 7. Continued bleeding from incision. 8. Increased pain, redness, or drainage from the incision   The clinic staff is available to answer your questions during regular business hours (8:30am-5pm).  Please dont hesitate to call and ask to speak to one of our nurses for clinical concerns.   If you have a medical emergency, go to the nearest emergency room or call 911.  A surgeon from [facility] is always on call at the [facility], [place] [address], [place], [place]  [address] ? MAIN: [phone] ? TOLL FREE: [phone] ?  FAX [phone] [email or web address]

## 2019-03-23 NOTE — Anesthesia Procedure Notes (Signed)
Procedure Name: Intubation Date/Time: 03/23/2019 12:20 PM Performed by: Glory Buff, CRNA Pre-anesthesia Checklist: Patient identified, Emergency Drugs available, Suction available and Patient being monitored Patient Re-evaluated:Patient Re-evaluated prior to induction Oxygen Delivery Method: Circle system utilized Preoxygenation: Pre-oxygenation with 100% oxygen Induction Type: IV induction Ventilation: Mask ventilation without difficulty Laryngoscope Size: Miller and 3 Grade View: Grade II Tube type: Oral Tube size: 7.0 mm Number of attempts: 1 Airway Equipment and Method: Stylet and Oral airway Placement Confirmation: ETT inserted through vocal cords under direct vision,  positive ETCO2 and breath sounds checked- equal and bilateral Secured at: 21 cm Tube secured with: Tape Dental Injury: Teeth and Oropharynx as per pre-operative assessment

## 2019-03-23 NOTE — Op Note (Addendum)
Operative Note  Lydia Taylor 43 y.o. female [id number]  03/23/2019  Surgeon: Clovis Riley MD FACS  Assistant: Alphonsa Overall MD FACS  Procedure performed: Laparoscopic Cholecystectomy  Procedure classification: elective  Preop diagnosis: biliary colic Post-op diagnosis/intraop findings: significant chronic cholecystitis, enlarged liver  Specimens: gallbladder  Retained items: none  EBL: 11HE  Complications: none  Description of procedure: After obtaining informed consent the patient was brought to the operating room. Antibiotics were administered. SCD's were applied. General endotracheal anesthesia was initiated and a formal time-out was performed. The abdomen was prepped and draped in the usual sterile fashion and the abdomen was entered using an infraumbilical veress needle after instilling the site with local. Insufflation to 67mmHg was obtained, 62mm trocar and camera inserted, and gross inspection revealed no evidence of injury from our entry or other intraabdominal abnormalities. Laparoscopic assisted TAPS block was performed with exparel mixed with 0.25% marcaine. Two 66mm trocars were introduced in the right midclavicular and right anterior axillary lines under direct visualization and following infiltration with local. An 78mm trocar was placed in the epigastrium.   The gallbladder was retracted cephalad and the infundibulum was retracted laterally. The gallbladder was thickened, contracted and pale with fibrotic surrounding peritoneum and omental adhesions. There is a stone impacted in the distal gallbladder fundus. A combination of hook electrocautery and blunt dissection was utilized to clear the peritoneum from the neck and cystic duct, circumferentially isolating the cystic artery and cystic duct and lifting the gallbladder from the cystic plate. This was somewhat difficult given the fibrotic tissues, and cautery was used only on transparent strands of tissue to ensure no  injury to the adjacent structures. There is a small posterior branch cystic artery. The critical view of safety was achieved with the cystic artery, cystic duct, and liver bed visualized between them with no other structures. The gallbladder was nearly completely mobilized from the gallbladder bed prior to dividing any structures to re-affirm the anatomy. The artery was clipped with two clips proximally and one distally and divided as was the cystic duct with three clips on the proximal end. The small posterior branch vessel was ligated with a single clip proximally. The gallbladder was dissected from the liver plate using electrocautery. Once freed the gallbladder was placed in an ecosac bag and removed through the epigastric trocar site, which had to be extended slightly. A small amount of bleeding on the liver bed was controlled with cautery and surgicel snow. Some bile had been spilled from the gallbladder during its dissection from the liver bed. This was aspirated and the right upper quadrant was irrigated copiously until the effluent was clear. Hemostasis was once again confirmed, and reinspection of the abdomen revealed no injuries. The clips were well opposed without any bile leak from the duct or the liver bed. The 4mm trocar site in the epigastrium was closed with three 0 vicryls in the fascia under direct visualization using a PMI device. The wound was irrigated with betadine as some bile had leaked from the retrieval bag. The abdomen was desufflated and all trocars removed. The skin incisions were closed with running subcuticular monocryl and Dermabond. The patient was awakened, extubated and transported to the recovery room in stable condition.    All counts were correct at the completion of the case.

## 2019-03-23 NOTE — Transfer of Care (Signed)
Immediate Anesthesia Transfer of Care Note  Patient: DEMONI PARMAR  Procedure(s) Performed: LAPAROSCOPIC CHOLECYSTECTOMY (N/A Abdomen)  Patient Location: PACU  Anesthesia Type:General  Level of Consciousness: drowsy, patient cooperative and responds to stimulation  Airway & Oxygen Therapy: Patient Spontanous Breathing and Patient connected to face mask oxygen  Post-op Assessment: Report given to RN and Post -op Vital signs reviewed and stable  Post vital signs: Reviewed and stable  Last Vitals:  Vitals Value Taken Time  BP    Temp    Pulse 80 03/23/19 1401  Resp 21 03/23/19 1401  SpO2 99 % 03/23/19 1401  Vitals shown include unvalidated device data.  Last Pain:  Vitals:   03/23/19 1021  TempSrc:   PainSc: 0-No pain         Complications: No apparent anesthesia complications

## 2019-03-23 NOTE — Anesthesia Postprocedure Evaluation (Signed)
Anesthesia Post Note  Patient: Lydia Taylor  Procedure(s) Performed: LAPAROSCOPIC CHOLECYSTECTOMY (N/A Abdomen)     Patient location during evaluation: PACU Anesthesia Type: General Level of consciousness: awake and alert Pain management: pain level controlled Vital Signs Assessment: post-procedure vital signs reviewed and stable Respiratory status: spontaneous breathing, nonlabored ventilation and respiratory function stable Cardiovascular status: blood pressure returned to baseline and stable Postop Assessment: no apparent nausea or vomiting Anesthetic complications: no    Last Vitals:  Vitals:   03/23/19 1450 03/23/19 1515  BP: (!) 126/94 106/71  Pulse: 70 71  Resp:    Temp: 36.7 C   SpO2: 100% 100%    Last Pain:  Vitals:   03/23/19 1445  TempSrc:   PainSc: 0-No pain                 Catalina Gravel

## 2019-03-23 NOTE — Interval H&P Note (Signed)
History and Physical Interval Note:  03/23/2019 11:34 AM  Lydia Taylor  has presented today for surgery, with the diagnosis of BILIARY COLIC.  The various methods of treatment have been discussed with the patient and family. After consideration of risks, benefits and other options for treatment, the patient has consented to  Procedure(s): LAPAROSCOPIC CHOLECYSTECTOMY (N/A) as a surgical intervention.  The patient's history has been reviewed, patient examined, no change in status, stable for surgery.  I have reviewed the patient's chart and labs.  Questions were answered to the patient's satisfaction.     Abdallah Hern Rich Brave

## 2019-03-24 ENCOUNTER — Encounter (HOSPITAL_COMMUNITY): Payer: Self-pay | Admitting: Surgery

## 2019-03-25 LAB — SURGICAL PATHOLOGY

## 2019-04-05 ENCOUNTER — Other Ambulatory Visit (HOSPITAL_COMMUNITY): Payer: Self-pay | Admitting: Cardiology

## 2019-04-05 ENCOUNTER — Telehealth (HOSPITAL_COMMUNITY): Payer: Self-pay | Admitting: *Deleted

## 2019-04-05 NOTE — Telephone Encounter (Signed)
Left vm requesting pt return my call about changing office visit on 11/30 to virtual visit due to covid 19 precautions.

## 2019-04-05 NOTE — Telephone Encounter (Signed)
PT returned previous call to confirm it was ok to switch visit from in person to virtual.  Change was made.

## 2019-04-09 ENCOUNTER — Other Ambulatory Visit: Payer: Self-pay | Admitting: Medical

## 2019-04-12 ENCOUNTER — Other Ambulatory Visit: Payer: Self-pay

## 2019-04-12 ENCOUNTER — Ambulatory Visit (HOSPITAL_COMMUNITY)
Admission: RE | Admit: 2019-04-12 | Discharge: 2019-04-12 | Disposition: A | Discharge status: Home / Self Care | Payer: Commercial Managed Care - PPO | Source: Ambulatory Visit | Attending: Cardiology | Admitting: Cardiology

## 2019-04-12 ENCOUNTER — Encounter (HOSPITAL_COMMUNITY): Payer: Self-pay | Admitting: *Deleted

## 2019-04-12 DIAGNOSIS — I5022 Chronic systolic (congestive) heart failure: Secondary | ICD-10-CM | POA: Diagnosis not present

## 2019-04-12 NOTE — Patient Instructions (Addendum)
Continue current medications.  If you have any questions or concerns before your next appointment please send [place] a message through [place] or call our office at [phone].  Your physician recommends that you schedule a follow-up appointment in: 3 months

## 2019-04-12 NOTE — Progress Notes (Signed)
Orders per Dr Aundra Dubin:  1. F/u in 3 months   Attempted to call pt to schedule, no answer.  AVS sent via mychart, message sent to schedulers to arrange f/u appt.

## 2019-04-12 NOTE — Progress Notes (Signed)
Due to national recommendations of social distancing due to COVID 19, Audio/video telehealth visit is felt to be most appropriate for this patient at this time.  See MyChart message from today for patient consent regarding telehealth for Lydia Taylor.  Date:  04/12/2019   ID:  Lydia Taylor, DOB 1976-02-21, MRN [id number]  Location: Home  Provider location: [facility] Type of Visit: Established patient  PCP:  Esperanza Richters, PA-C  Cardiologist:  Dr. Shirlee Latch  Chief Complaint: Shortness of breath   History of Present Illness: Lydia Taylor is a 43 y.o. female who presents via audio/video conferencing for a telehealth visit today.     she denies symptoms worrisome for COVID 19.  Patient has a history of diabetes and fibromyalgia.  She was referred by Dr. Allyson Sabal for evaluation of CHF.  For about 2 years, she had noted an elevated heart rate.  For over a year, she had noted dyspnea walking about 50 yards ("halfway around the grocery store").  She avoids stairs due to dyspnea.  She recalls an episode about 2 years ago when she developed severe chest pain and dyspnea but did not seek medical care.  She continues to work full time.  She does not drink ETOH or use drugs.  She has never been pregnant.  She has significant daytime sleepiness.   Cardiac MRI was done in 8/19 showing EF 20% with preserved RV function, no LGE.    Repeat echo was done in 11/19 showing EF 30% with normal RV size and systolic function.   Echo in 8/20 showed EF 35% with normal RV size and systolic function.    Repeat cardiac MRI in 9/20 showed EF 42%, normal RV with EF 53%, no LGE.   She is now taking Corlanor and feels considerably better after starting it.  HR generally in 80s now.  No significant exertional dyspnea.  No lightheadedness.  Rare atypical chest pain, sharp and nonexertional.  No orthopnea/PND.  She is wearing CPAP nightly. She had an uncomplicated laparoscopic cholecystectomy  earlier this month.   Labs (5/19): K 4.8, creatinine 0.65 Labs (7/19): ANA negative, transferrin saturation 11%, SPEP negative.  Labs (8/19): K 3.8, creatinine 0.77 Labs (9/19): K 4.2, creatinine 0.57 Labs (10/19): hgb 14 Labs (2/20): K 4.3, creatinine 0.57 Labs (11/20): K 4.5, creatinine 0.50  PMH: 1. Fibromyalgia 2. PCOS 3. Type II diabetes 4. Chronic systolic CHF: Nonischemic cardiomyopathy.  - Echo (4/19): Severe LV dilation with EF 20-25%, restrictive diastolic function.  - Coronary CTA (5/19): CAC 0, normal coronaries.  - Cardiac MRI (8/19): moderate LV dilation with EF 20%, normal RV size and systolic function, EF 60%.  No LGE.  - CPX (9/19): RER 1.03, peak VO2 19, VE/VCO2 slope 38 => submaximal study, moderately decreased functional capacity.  - Echo (11/19): EF 30%, normal RV size and systolic function, no MR.  - Echo (8/20): EF 35%, normal RV size and systolic function.  - Cardiac MRI (9/20): LV EF 42% with mild-moderate diffuse hypokinesis, RV EF 53%, no LGE.  5. Sinus tachycardia: Holter (5/19) with average HR 95, rare PVCs, no other arrhythmias.  6. OSA  7. Laparoscopic cholecystectomy 11/20.   SH: Rare ETOH, no smoking.  Married, no children or pregnancies.  Works full time in [employer].    FH: No history of CAD or cardiomyopathy.  ROS: All systems reviewed and negative except as per HPI.   Current Outpatient Medications  Medication Sig Dispense Refill  . Apple Cider  Vinegar 500 MG TABS Take 500 mg by mouth daily.    . Ascorbic Acid (VITAMIN C) 1000 MG tablet Take 1,000 mg by mouth daily.    Marland Kitchen atorvastatin (LIPITOR) 40 MG tablet Take 40 mg by mouth daily.     . bisoprolol (ZEBETA) 10 MG tablet TAKE 1 TABLET BY MOUTH  DAILY (Patient taking differently: Take 10 mg by mouth daily. ) 90 tablet 3  . cholecalciferol (VITAMIN D3) 25 MCG (1000 UT) tablet Take 1,000 Units by mouth daily.    . clonazePAM (KLONOPIN) 0.5 MG tablet Take 0.5 mg by mouth 2 (two) times daily as  needed for anxiety.    . docusate sodium (COLACE) 100 MG capsule Take 1 capsule (100 mg total) by mouth 2 (two) times daily. 60 capsule 0  . empagliflozin (JARDIANCE) 25 MG TABS tablet Take 25 mg by mouth daily.    Marland Kitchen ENTRESTO 49-51 MG Take 1 tablet by mouth twice daily 60 tablet 0  . finasteride (PROSCAR) 5 MG tablet Take 2.5 mg by mouth daily.    . furosemide (LASIX) 20 MG tablet Take 3 tablets (60 mg total) by mouth daily. 30 tablet 3  . glimepiride (AMARYL) 1 MG tablet TAKE 1 TABLET BY MOUTH ONCE DAILY WITH BREAKFAST (Patient taking differently: Take 1 mg by mouth daily with breakfast. ) 90 tablet 0  . ivabradine (CORLANOR) 5 MG TABS tablet Take 1 tablet (5 mg total) by mouth 2 (two) times daily with a meal. 60 tablet 6  . KRILL OIL PO Take 750 mg by mouth daily.    Marland Kitchen lamoTRIgine (LAMICTAL) 25 MG tablet Take 1 tablet by mouth twice daily 60 tablet 0  . levothyroxine (SYNTHROID, LEVOTHROID) 25 MCG tablet Take 25 mcg by mouth daily before breakfast.    . metFORMIN (GLUCOPHAGE) 1000 MG tablet Take 1,000 mg by mouth 2 (two) times daily with a meal.     . Multiple Vitamins-Minerals (HAIR SKIN AND NAILS FORMULA PO) Take 1 tablet by mouth 2 (two) times daily.     . norethindrone (MICRONOR,CAMILA,ERRIN) 0.35 MG tablet Take 1 tablet by mouth daily.    . Semaglutide, 1 MG/DOSE, (OZEMPIC, 1 MG/DOSE,) 2 MG/1.5ML SOPN Inject 1 mg into the skin every 7 (seven) days.    Marland Kitchen spironolactone (ALDACTONE) 25 MG tablet TAKE 1 TABLET BY MOUTH  DAILY (Patient taking differently: Take 25 mg by mouth daily. ) 90 tablet 3  . traMADol (ULTRAM) 50 MG tablet Take 1 tablet (50 mg total) by mouth every 6 (six) hours as needed. 20 tablet 0  . Turmeric 500 MG CAPS Take 500 mg by mouth daily.    . vitamin B-12 (CYANOCOBALAMIN) 1000 MCG tablet Take 1,000 mcg by mouth daily.      No current facility-administered medications for this encounter.    Exam:  (Video/Tele Health Call; Exam is subjective and or/visual.) HR 81 General:   Speaks in full sentences. No resp difficulty. Lungs: Normal respiratory effort with conversation.  Abdomen: Non-distended per patient report Extremities: Pt denies edema. Neuro: Alert & oriented x 3.  Neck: No JVD  Assessment/Plan: 1. Chronic systolic CHF: Echo in 4/33 with severe LV dilation, EF 20-25%.  Coronary CT showed no evidence for CAD => nonischemic cardiomyopathy.  No prior pregnancies, so not peri-partum cardiomyopathy.  Mild sinus tachycardia, most recent holter with avg HR 95.  Do not think this is tachy-mediated CMP.  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  Cardiac MRI in 8/19 showed EF 20%, normal RV, no myocardial LGE.  CPX with moderate functional limitation from HF.  Echo in 11/19 showed EF 30%.  Echo in 8/20 showed EF 35%, borderline for ICD.  Cardiac MRI was then done in 9/20 for more accurate quantification of LV function, and EF found to be 42%.  She has NYHA class II symptoms.  She is not volume overloaded on exam.  HR and symptoms better on Corlanor.  - Continue Entresto 49/51 bid, BP has been too low to increase.   - Continue bisoprolol 10 mg daily.    - Continue spironolactone 25 mg daily.   - Continue Lasix 60 mg daily, stable BMET in 11/20.    - She is on empagliflozin.  - Narrow QRS, would not be CRT candidate.  LV function now appears to be improved out of ICD range.  2. OSA: She is using CPAP nightly.   COVID screen The patient does not have any symptoms that suggest any further testing/ screening at this time.  Social distancing reinforced today.  Patient Risk: After full review of this patients clinical status, I feel that they are at moderate risk for cardiac decompensation at this time.  Relevant cardiac medications were reviewed at length with the patient today. The patient does not have concerns regarding their medications at this time.   Recommended follow-up:  3  months.   Today, I have spent 16 minutes with the patient with telehealth technology discussing the above issues .    Signed, Marca Ancona, MD  04/12/2019  [facility] [address] [facility] [place] [address] [phone] (office) [phone] (fax)

## 2019-04-21 ENCOUNTER — Other Ambulatory Visit (HOSPITAL_COMMUNITY): Payer: Self-pay | Admitting: Cardiology

## 2019-05-04 ENCOUNTER — Encounter (HOSPITAL_COMMUNITY): Payer: Self-pay

## 2019-05-08 ENCOUNTER — Other Ambulatory Visit (HOSPITAL_COMMUNITY): Payer: Self-pay | Admitting: Cardiology

## 2019-05-15 ENCOUNTER — Other Ambulatory Visit: Payer: Self-pay | Admitting: Medical

## 2019-05-25 ENCOUNTER — Encounter: Payer: Self-pay | Admitting: Medical

## 2019-05-27 ENCOUNTER — Telehealth: Payer: Self-pay | Admitting: Medical

## 2019-05-27 DIAGNOSIS — Z1211 Encounter for screening for malignant neoplasm of colon: Secondary | ICD-10-CM

## 2019-05-27 NOTE — Telephone Encounter (Signed)
Referral to Gi placed

## 2019-06-14 ENCOUNTER — Other Ambulatory Visit: Payer: Self-pay | Admitting: Medical

## 2019-06-15 NOTE — Telephone Encounter (Signed)
Yes please get her scheduled within a month.  Thanks.

## 2019-06-15 NOTE — Telephone Encounter (Signed)
Lydia Taylor -- I sent 30 day supply of lamotrigine. Pt last seen by you 01/13/19 and has no future appts scheduled. When should pt follow up in the office?

## 2019-07-08 ENCOUNTER — Other Ambulatory Visit: Payer: Self-pay

## 2019-07-08 ENCOUNTER — Ambulatory Visit: Payer: Commercial Managed Care - PPO | Admitting: Medical

## 2019-07-08 VITALS — BP 98/64 | Wt 171.0 lb

## 2019-07-08 DIAGNOSIS — R079 Chest pain, unspecified: Secondary | ICD-10-CM | POA: Diagnosis not present

## 2019-07-08 DIAGNOSIS — M940 Chondrocostal junction syndrome [Tietze]: Secondary | ICD-10-CM | POA: Diagnosis not present

## 2019-07-08 LAB — TROPONIN I (HIGH SENSITIVITY): High Sens Troponin I: 4 ng/L (ref 2–17)

## 2019-07-08 MED ORDER — DICLOFENAC SODIUM 75 MG PO TBEC: 75.0000 mg | DELAYED_RELEASE_TABLET | Freq: Two times a day (BID) | ORAL | 0 refills | Status: DC

## 2019-07-08 NOTE — Progress Notes (Signed)
Subjective:    Patient ID: Lydia Taylor, female    DOB: 10-31-1975, 44 y.o.   MRN: [id number]  HPI     History of Present Illness: She states has pain in chest wall. Pt thinks arthritis pain. States this based on mother having arthritis in her chest. Pt has talked with her cardiologist and she states he told her that pain is not cardiac. Pt states in past 3 months ago when she discussed with them twice and told pain not cardiac per pt.   Pt had mri done in past.  IMPRESSION: 1. Moderately dilated left ventricle with EF 20%, diffuse hypokinesis.  2.  Normal right ventricular size and systolic function, EF 60%.  3. No myocardial LGE, no evidence for prior infarction, infiltrative disease, or myocarditis.   Pt states pain happens about 1-2 times every 2 weeks.   She has some pain this morning. Was at 7:30 am and lasted less than an hour. No sob, no jaw pain or shoulder pain. She states several years ago had shoulder pain and got injection in past.    2 weeks ago she had similar pain and put heating pad on chest.. Pain resolved.  Pt is diabetic. She has high cholesterol.  Pt notes in past on deep inspiration had some chest pain. This am took deep breath and it did hurt.   Part of exam at home but then had to come to office and interviewed more in depth.      Observations/Objective: Past Medical History:  Diagnosis Date  . Anxiety    occasional panic atack  . CHF (congestive heart failure) (HCC)   . Chicken pox   . Diabetes mellitus without complication (HCC)   . DM (diabetes mellitus), type 2 (HCC)   . Fibromyalgia   . Hypothyroidism   . Mood disorder (HCC)   . OSA on CPAP 09/02/2018    mild OSA with an AHI of 6.4/hr and O2 desaturations as low as 85% and underwent CPAP titration to 13cm H2O.  Marland Kitchen PCOS (polycystic ovarian syndrome)   . Sinus tachycardia      Social History   Socioeconomic History  . Marital status: Married    Spouse name: Not on file   . Number of children: 2  . Years of education: 65  . Highest education level: High school graduate  Occupational History  . Occupation: Theatre manager  Tobacco Use  . Smoking status: Never Smoker  . Smokeless tobacco: Never Used  Substance and Sexual Activity  . Alcohol use: Yes    Alcohol/week: 0.0 standard drinks    Comment: socially  . Drug use: No  . Sexual activity: Yes    Partners: Male  Other Topics Concern  . Not on file  Social History Narrative  . Not on file   Social Determinants of Health   Financial Resource Strain: Low Risk   . Difficulty of Paying Living Expenses: Not very hard  Food Insecurity: No Food Insecurity  . Worried About Programme researcher, broadcasting/film/video in the Last Year: Never true  . Ran Out of Food in the Last Year: Never true  Transportation Needs: No Transportation Needs  . Lack of Transportation (Medical): No  . Lack of Transportation (Non-Medical): No  Physical Activity: Inactive  . Days of Exercise per Week: 0 days  . Minutes of Exercise per Session: 0 min  Stress: No Stress Concern Present  . Feeling of Stress : Only a little  Social Connections:   .  Frequency of Communication with Friends and Family: Not on file  . Frequency of Social Gatherings with Friends and Family: Not on file  . Attends Religious Services: Not on file  . Active Member of Clubs or Organizations: Not on file  . Attends Banker Meetings: Not on file  . Marital Status: Not on file  Intimate Partner Violence:   . Fear of Current or Ex-Partner: Not on file  . Emotionally Abused: Not on file  . Physically Abused: Not on file  . Sexually Abused: Not on file    Past Surgical History:  Procedure Laterality Date  . BREAST REDUCTION SURGERY    . CHOLECYSTECTOMY N/A 03/23/2019   Procedure: LAPAROSCOPIC CHOLECYSTECTOMY;  Surgeon: Berna Bue, MD;  Location: [facility];  Service: General;  Laterality: N/A;  . WISDOM TOOTH EXTRACTION      Family History  Problem  Relation Age of Onset  . Thyroid disease Mother   . Diabetes Father   . Hypertension Father   . Hyperlipidemia Father   . Stroke Maternal Grandmother     Allergies  Allergen Reactions  . Amoxicillin Hives    Did it involve swelling of the face/tongue/throat, SOB, or low BP? No Did it involve sudden or severe rash/hives, skin peeling, or any reaction on the inside of your mouth or nose? No Did you need to seek medical attention at a hospital or doctor's office? No When did it last happen?childhood If all above answers are "NO", may proceed with cephalosporin use.   . Other     NO BLOOD PRODUCTS - JEHOVAH'S WITNESS    Current Outpatient Medications on File Prior to Visit  Medication Sig Dispense Refill  . Apple Cider Vinegar 500 MG TABS Take 500 mg by mouth daily.    . Ascorbic Acid (VITAMIN C) 1000 MG tablet Take 1,000 mg by mouth daily.    Marland Kitchen atorvastatin (LIPITOR) 40 MG tablet Take 40 mg by mouth daily.     . bisoprolol (ZEBETA) 10 MG tablet TAKE 1 TABLET BY MOUTH  DAILY (Patient taking differently: Take 10 mg by mouth daily. ) 90 tablet 3  . cholecalciferol (VITAMIN D3) 25 MCG (1000 UT) tablet Take 1,000 Units by mouth daily.    . clonazePAM (KLONOPIN) 0.5 MG tablet Take 0.5 mg by mouth 2 (two) times daily as needed for anxiety.    . empagliflozin (JARDIANCE) 25 MG TABS tablet Take 25 mg by mouth daily.    Marland Kitchen ENTRESTO 49-51 MG Take 1 tablet by mouth twice daily 180 tablet 3  . finasteride (PROSCAR) 5 MG tablet Take 2.5 mg by mouth daily.    . furosemide (LASIX) 20 MG tablet Take 3 tablets by mouth once daily 90 tablet 0  . glimepiride (AMARYL) 1 MG tablet TAKE 1 TABLET BY MOUTH ONCE DAILY WITH BREAKFAST (Patient taking differently: Take 1 mg by mouth daily with breakfast. ) 90 tablet 0  . ivabradine (CORLANOR) 5 MG TABS tablet Take 1 tablet (5 mg total) by mouth 2 (two) times daily with a meal. 60 tablet 6  . KRILL OIL PO Take 750 mg by mouth daily.    Marland Kitchen lamoTRIgine  (LAMICTAL) 25 MG tablet Take 1 tablet by mouth twice daily 60 tablet 0  . levothyroxine (SYNTHROID, LEVOTHROID) 25 MCG tablet Take 25 mcg by mouth daily before breakfast.    . metFORMIN (GLUCOPHAGE) 1000 MG tablet Take 1,000 mg by mouth 2 (two) times daily with a meal.     .  Multiple Vitamins-Minerals (HAIR SKIN AND NAILS FORMULA PO) Take 1 tablet by mouth 2 (two) times daily.     . norethindrone (MICRONOR,CAMILA,ERRIN) 0.35 MG tablet Take 1 tablet by mouth daily.    . Semaglutide, 1 MG/DOSE, (OZEMPIC, 1 MG/DOSE,) 2 MG/1.5ML SOPN Inject 1 mg into the skin every 7 (seven) days.    Marland Kitchen spironolactone (ALDACTONE) 25 MG tablet TAKE 1 TABLET BY MOUTH  DAILY (Patient taking differently: Take 25 mg by mouth daily. ) 90 tablet 3  . Turmeric 500 MG CAPS Take 500 mg by mouth daily.    . vitamin B-12 (CYANOCOBALAMIN) 1000 MCG tablet Take 1,000 mcg by mouth daily.      No current facility-administered medications on file prior to visit.    BP 98/64 (BP Location: Right Arm, Patient Position: Sitting)   Wt 171 lb (77.6 kg)   LMP 06/25/2019   BMI 30.29 kg/m    Assessment and Plan:   Follow Up Instructions:    I discussed the assessment and treatment plan with the patient. The patient was provided an opportunity to ask questions and all were answered. The patient agreed with the plan and demonstrated an understanding of the instructions.   The patient was advised to call back or seek an in-person evaluation if the symptoms worsen or if the condition fails to improve as anticipated.  I provided 40+ minutes of non-face-to-face time during this encounter.   Mackie Pai, PA-C  Pt in for complaint of chest pain. Pt    Review of Systems     Objective:   Physical Exam        Assessment & Plan:  [phone]

## 2019-07-08 NOTE — Patient Instructions (Signed)
You do have reproducible pain on palpation today. This does support costochondritis but you do have risk factors. Ekg shows sinus rhythm. Some artifact in V3 but on comparison to last prior the same. Will get troponin stat today. Follow result. If elevated then be seen in ED for repeat. With Chf hx sometime elevation can be from demand ischemia.  Presently use tylenol for pain. Will try to discuss use of nsaids with pharmacist in light of cardiac meds with potential interaction.  If pain worsens or severe then recommend ED evaluation.  Follow up 7-10 days.

## 2019-07-09 ENCOUNTER — Telehealth (HOSPITAL_COMMUNITY): Payer: Self-pay | Admitting: Pharmacist

## 2019-07-09 NOTE — Telephone Encounter (Signed)
Provided patient with Corlanor samples.  Medication: Corlanor 5 mg Quantity: 1 bottle  Lot: [id number] Expiration date: 05/24  Dropped samples off at front desk for patient.   Karle Plumber, PharmD, BCPS, CPP [facility] Pharmacist [phone]

## 2019-07-09 NOTE — Telephone Encounter (Signed)
Spoke with patient on the phone regarding re-enrollment of Corlanor patient assistance. She will fax in signed application. Once received, will send into [employer].   Received notification from [facility] that prior authorization for Corlanor is required.   PA submitted online through portal. Key [id number] Status is pending   Will continue to follow.  Karle Plumber, PharmD, BCPS, BCCP, CPP [facility] Pharmacist [phone]

## 2019-07-13 ENCOUNTER — Other Ambulatory Visit: Payer: Self-pay

## 2019-07-13 ENCOUNTER — Encounter (HOSPITAL_COMMUNITY): Payer: Self-pay

## 2019-07-13 ENCOUNTER — Ambulatory Visit (HOSPITAL_COMMUNITY)
Admission: RE | Admit: 2019-07-13 | Discharge: 2019-07-13 | Disposition: A | Discharge status: Home / Self Care | Payer: Commercial Managed Care - PPO | Source: Ambulatory Visit | Attending: Cardiology | Admitting: Cardiology

## 2019-07-13 DIAGNOSIS — I5022 Chronic systolic (congestive) heart failure: Secondary | ICD-10-CM

## 2019-07-13 NOTE — Progress Notes (Signed)
Due to national recommendations of social distancing due to Alpha 19, Audio/video telehealth visit is felt to be most appropriate for this patient at this time.  See MyChart message from today for patient consent regarding telehealth for [facility].  PCP:  Mackie Pai, PA-C  Cardiologist:  Dr. Aundra Dubin  Chief Complaint: Shortness of breath   History of Present Illness: Lydia Taylor is a 44 y.o. female who presents via audio/video conferencing for a telehealth visit today.     she denies symptoms worrisome for COVID 19.  Patient has a history of diabetes and fibromyalgia.  She was referred by Dr. Gwenlyn Found for evaluation of CHF.  For about 2 years, she had noted an elevated heart rate.  For over a year, she had noted dyspnea walking about 50 yards ("halfway around the grocery store").  She avoids stairs due to dyspnea.  She recalls an episode about 2 years ago when she developed severe chest pain and dyspnea but did not seek medical care.  She continues to work full time.  She does not drink ETOH or use drugs.  She has never been pregnant.  She has significant daytime sleepiness.   Cardiac MRI was done in 8/19 showing EF 20% with preserved RV function, no LGE.    Repeat echo was done in 11/19 showing EF 30% with normal RV size and systolic function.   Echo in 8/20 showed EF 35% with normal RV size and systolic function.    Repeat cardiac MRI in 9/20 showed EF 42%, normal RV with EF 53%, no LGE.   She is generally doing well.  No significant exertional dyspnea.  No lightheadedness.  She does get fatigued/tired at times.  SBP generally in 100s, sometimes drops as low at 85. She does not get lightheaded or fall.  She is using CPAP.   Labs (5/19): K 4.8, creatinine 0.65 Labs (7/19): ANA negative, transferrin saturation 11%, SPEP negative.  Labs (8/19): K 3.8, creatinine 0.77 Labs (9/19): K 4.2, creatinine 0.57 Labs (10/19): hgb 14 Labs (2/20): K 4.3, creatinine 0.57 Labs (11/20): K  4.5, creatinine 0.50  PMH: 1. Fibromyalgia 2. PCOS 3. Type II diabetes 4. Chronic systolic CHF: Nonischemic cardiomyopathy.  - Echo (4/19): Severe LV dilation with EF 20-25%, restrictive diastolic function.  - Coronary CTA (5/19): CAC 0, normal coronaries.  - Cardiac MRI (8/19): moderate LV dilation with EF 20%, normal RV size and systolic function, EF 95%.  No LGE.  - CPX (9/19): RER 1.03, peak VO2 19, VE/VCO2 slope 38 => submaximal study, moderately decreased functional capacity.  - Echo (11/19): EF 30%, normal RV size and systolic function, no MR.  - Echo (8/20): EF 35%, normal RV size and systolic function.  - Cardiac MRI (9/20): LV EF 42% with mild-moderate diffuse hypokinesis, RV EF 53%, no LGE.  5. Sinus tachycardia: Holter (5/19) with average HR 95, rare PVCs, no other arrhythmias.  6. OSA  7. Laparoscopic cholecystectomy 11/20.   SH: Rare ETOH, no smoking.  Married, no children or pregnancies.  Works full time in [employer].    FH: No history of CAD or cardiomyopathy.  ROS: All systems reviewed and negative except as per HPI.   Current Outpatient Medications  Medication Sig Dispense Refill  . Apple Cider Vinegar 500 MG TABS Take 500 mg by mouth daily.    . Ascorbic Acid (VITAMIN C) 1000 MG tablet Take 1,000 mg by mouth daily.    Marland Kitchen atorvastatin (LIPITOR) 40 MG tablet Take 40 mg by  mouth daily.     . bisoprolol (ZEBETA) 10 MG tablet TAKE 1 TABLET BY MOUTH  DAILY (Patient taking differently: Take 10 mg by mouth daily. ) 90 tablet 3  . cholecalciferol (VITAMIN D3) 25 MCG (1000 UT) tablet Take 1,000 Units by mouth daily.    . clonazePAM (KLONOPIN) 0.5 MG tablet Take 0.5 mg by mouth 2 (two) times daily as needed for anxiety.    . empagliflozin (JARDIANCE) 25 MG TABS tablet Take 25 mg by mouth daily.    Marland Kitchen ENTRESTO 49-51 MG Take 1 tablet by mouth twice daily 180 tablet 3  . finasteride (PROSCAR) 5 MG tablet Take 2.5 mg by mouth daily.    . furosemide (LASIX) 20 MG tablet Take 3 tablets  by mouth once daily 90 tablet 0  . glimepiride (AMARYL) 1 MG tablet TAKE 1 TABLET BY MOUTH ONCE DAILY WITH BREAKFAST (Patient taking differently: Take 1 mg by mouth daily with breakfast. ) 90 tablet 0  . ivabradine (CORLANOR) 5 MG TABS tablet Take 1 tablet (5 mg total) by mouth 2 (two) times daily with a meal. 60 tablet 6  . KRILL OIL PO Take 750 mg by mouth daily.    Marland Kitchen lamoTRIgine (LAMICTAL) 25 MG tablet Take 1 tablet by mouth twice daily 60 tablet 0  . levothyroxine (SYNTHROID, LEVOTHROID) 25 MCG tablet Take 25 mcg by mouth daily before breakfast.    . metFORMIN (GLUCOPHAGE) 1000 MG tablet Take 1,000 mg by mouth 2 (two) times daily with a meal.     . Multiple Vitamins-Minerals (HAIR SKIN AND NAILS FORMULA PO) Take 1 tablet by mouth 2 (two) times daily.     . norethindrone (MICRONOR,CAMILA,ERRIN) 0.35 MG tablet Take 1 tablet by mouth daily.    . Semaglutide, 1 MG/DOSE, (OZEMPIC, 1 MG/DOSE,) 2 MG/1.5ML SOPN Inject 1 mg into the skin every 7 (seven) days.    Marland Kitchen spironolactone (ALDACTONE) 25 MG tablet TAKE 1 TABLET BY MOUTH  DAILY (Patient taking differently: Take 25 mg by mouth daily. ) 90 tablet 3  . Turmeric 500 MG CAPS Take 500 mg by mouth daily.    . vitamin B-12 (CYANOCOBALAMIN) 1000 MCG tablet Take 1,000 mcg by mouth daily.      No current facility-administered medications for this encounter.   Exam:  (Video/Tele Health Call; Exam is subjective and or/visual.) General:  Speaks in full sentences. No resp difficulty. Lungs: Normal respiratory effort with conversation.  Abdomen: Non-distended per patient report Extremities: Pt denies edema. Neuro: Alert & oriented x 3.   Assessment/Plan: 1. Chronic systolic CHF: Echo in 5/19 with severe LV dilation, EF 20-25%.  Coronary CT showed no evidence for CAD => nonischemic cardiomyopathy.  No prior pregnancies, so not peri-partum cardiomyopathy.  Mild sinus tachycardia, most recent holter with avg HR 95.  Do not think this is tachy-mediated CMP.  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  Cardiac MRI in 8/19 showed EF 20%, normal RV, no myocardial LGE.  CPX with moderate functional limitation from HF.  Echo in 11/19 showed EF 30%.  Echo in 8/20 showed EF 35%, borderline for ICD.  Cardiac MRI was then done in 9/20 for more accurate quantification of LV function, and EF found to be 42%.  She has NYHA class II symptoms.  Weight is stable, suspect that she is not volume overloaded.   - Continue Entresto 49/51 bid, BP has been too low to increase.   -  Continue bisoprolol 10 mg daily.    - Continue spironolactone 25 mg daily.   - Continue Lasix 60 mg daily, I will arrange for BMET.  - She is on empagliflozin.  - Narrow QRS, would not be CRT candidate.  LV function now appears to be improved out of ICD range.  2. OSA: She is using CPAP nightly.   COVID screen The patient does not have any symptoms that suggest any further testing/ screening at this time.  Social distancing reinforced today.  Patient Risk: After full review of this patients clinical status, I feel that they are at moderate risk for cardiac decompensation at this time.  Relevant cardiac medications were reviewed at length with the patient today. The patient does not have concerns regarding their medications at this time.   Recommended follow-up:  3 months.   Today, I have spent 16 minutes with the patient with telehealth technology discussing the above issues .    Signed, Marca Ancona, MD  07/13/2019  [facility] [address] [facility] [place] [address] [phone] (office) [phone] (fax)

## 2019-07-13 NOTE — Progress Notes (Signed)
Called patient, AVS reviewed. Lab and f/u appts scheduled. No further questions endorsed.  Information sent via mychart.

## 2019-07-13 NOTE — Patient Instructions (Signed)
NO medication changes!  Lab visit scheduled for 07/21/19 at 12pm. Garage code [phone] We will only contact you if something comes back abnormal or we need to make some changes. Otherwise no news is good news!  Your physician recommends that you schedule a follow-up appointment in: 3 months with Dr Shirlee Latch.   Wednesday, June 2nd, 2021 at 11:40am Garage Code [phone]  Please call office at [phone] option [phone] if you have any questions or concerns.   At the [facility], you and your health needs are our priority. As part of our continuing mission to provide you with exceptional heart care, we have created designated Provider Care Teams. These Care Teams include your primary Cardiologist (physician) and Advanced Practice Providers (APPs- Physician Assistants and Nurse Practitioners) who all work together to provide you with the care you need, when you need it.   You may see any of the following providers on your designated Care Team at your next follow up: Marland Kitchen Dr Arvilla Meres . Dr Marca Ancona . Tonye Becket, NP . Robbie Lis, PA . Karle Plumber, PharmD   Please be sure to bring in all your medications bottles to every appointment.

## 2019-07-16 ENCOUNTER — Other Ambulatory Visit: Payer: Self-pay | Admitting: Medical

## 2019-07-16 MED ORDER — IVABRADINE HCL 5 MG PO TABS: 5.0000 mg | ORAL_TABLET | Freq: Two times a day (BID) | ORAL | 11 refills | Status: DC

## 2019-07-16 NOTE — Telephone Encounter (Signed)
[facility] Patient Advocate Encounter  Prior Authorization for Corlanor has been approved.    PA# [id number] Effective dates: 07/16/19 through 07/15/20  Patients co-pay is $60.00  Activated Copay card for patient, this will reduce copay down to $20.00: [id number] PCN:[id number] Group:[id number] ID: [id number]  Since patient was approved through insurance, will not apply for manufacturer's assistance at this time.   Karle Plumber, PharmD, BCPS, BCCP, CPP [facility] Pharmacist [phone]

## 2019-07-21 ENCOUNTER — Other Ambulatory Visit (HOSPITAL_COMMUNITY): Payer: Commercial Managed Care - PPO

## 2019-07-27 ENCOUNTER — Other Ambulatory Visit (HOSPITAL_COMMUNITY): Payer: Commercial Managed Care - PPO

## 2019-07-27 ENCOUNTER — Encounter: Payer: Self-pay | Admitting: Medical

## 2019-08-11 ENCOUNTER — Other Ambulatory Visit: Payer: Self-pay

## 2019-08-11 ENCOUNTER — Ambulatory Visit (HOSPITAL_COMMUNITY)
Admission: RE | Admit: 2019-08-11 | Discharge: 2019-08-11 | Disposition: A | Discharge status: Home / Self Care | Payer: Commercial Managed Care - PPO | Source: Ambulatory Visit | Attending: Cardiology | Admitting: Cardiology

## 2019-08-11 DIAGNOSIS — I5022 Chronic systolic (congestive) heart failure: Secondary | ICD-10-CM | POA: Insufficient documentation

## 2019-08-11 LAB — BASIC METABOLIC PANEL
Anion gap: 13 (ref 5–15)
BUN: 11 mg/dL (ref 6–20)
CO2: 25 mmol/L (ref 22–32)
Calcium: 9.9 mg/dL (ref 8.9–10.3)
Chloride: 96 mmol/L — ABNORMAL LOW (ref 98–111)
Creatinine, Ser: 0.79 mg/dL (ref 0.44–1.00)
GFR calc Af Amer: >60 mL/min (ref >60)
GFR calc non Af Amer: >60 mL/min (ref >60)
Glucose, Bld: 355 mg/dL — ABNORMAL HIGH (ref 70–99)
Potassium: 4.1 mmol/L (ref 3.5–5.1)
Sodium: 134 mmol/L — ABNORMAL LOW (ref 135–145)

## 2019-08-13 ENCOUNTER — Encounter: Payer: Self-pay | Admitting: Medical

## 2019-08-13 ENCOUNTER — Other Ambulatory Visit: Payer: Self-pay | Admitting: Medical

## 2019-08-16 MED ORDER — LAMOTRIGINE 25 MG PO TABS: 25.0000 mg | ORAL_TABLET | Freq: Two times a day (BID) | ORAL | 0 refills | Status: DC

## 2019-08-27 ENCOUNTER — Other Ambulatory Visit (HOSPITAL_COMMUNITY): Payer: Self-pay | Admitting: Cardiology

## 2019-08-28 ENCOUNTER — Other Ambulatory Visit (HOSPITAL_COMMUNITY): Payer: Self-pay | Admitting: Cardiology

## 2019-10-13 ENCOUNTER — Other Ambulatory Visit: Payer: Self-pay

## 2019-10-13 ENCOUNTER — Ambulatory Visit (HOSPITAL_COMMUNITY)
Admission: RE | Admit: 2019-10-13 | Discharge: 2019-10-13 | Disposition: A | Discharge status: Home / Self Care | Payer: Commercial Managed Care - PPO | Source: Ambulatory Visit | Attending: Cardiology | Admitting: Cardiology

## 2019-10-13 ENCOUNTER — Telehealth (HOSPITAL_COMMUNITY): Payer: Self-pay | Admitting: Vascular Surgery

## 2019-10-13 ENCOUNTER — Encounter (HOSPITAL_COMMUNITY): Payer: Self-pay

## 2019-10-13 DIAGNOSIS — I5022 Chronic systolic (congestive) heart failure: Secondary | ICD-10-CM

## 2019-10-13 NOTE — Progress Notes (Addendum)
Called patient, reviewed AVS with her. Scheduled lab appointment for 6/4. To be called by scheduler to schedule echo and f/u. Voiced understanding avs sent via mychart

## 2019-10-13 NOTE — Telephone Encounter (Signed)
Left pt message to make sept appt w/ echo w/ [facility]/from televisit 10/13/19

## 2019-10-13 NOTE — Patient Instructions (Signed)
Labs scheduled for 10/15/19 at 2pm. Garage code [phone] We will only contact you if something comes back abnormal or we need to make some changes. Otherwise no news is good news!  Your physician has requested that you have an echocardiogram. Echocardiography is a painless test that uses sound waves to create images of your heart. It provides your doctor with information about the size and shape of your heart and how well your heart's chambers and valves are working. This procedure takes approximately one hour. There are no restrictions for this procedure.  Your physician recommends that you schedule a follow-up appointment in: September with Dr Shirlee Latch.  You will get a call to schedule this appointment.   At the [facility], you and your health needs are our priority. As part of our continuing mission to provide you with exceptional heart care, we have created designated Provider Care Teams. These Care Teams include your primary Cardiologist (physician) and Advanced Practice Providers (APPs- Physician Assistants and Nurse Practitioners) who all work together to provide you with the care you need, when you need it.   You may see any of the following providers on your designated Care Team at your next follow up: Marland Kitchen Dr Arvilla Meres . Dr Marca Ancona . Tonye Becket, NP . Robbie Lis, PA . Karle Plumber, PharmD   Please be sure to bring in all your medications bottles to every appointment.     '

## 2019-10-14 NOTE — Progress Notes (Signed)
Due to national recommendations of social distancing due to COVID 19, Audio/video telehealth visit is felt to be most appropriate for this patient at this time.  See MyChart message from today for patient consent regarding telehealth for [facility].  PCP:  Esperanza Richters, PA-C  Cardiologist:  Dr. Shirlee Latch  Chief Complaint: Shortness of breath   History of Present Illness: Lydia Taylor is a 44 y.o. female who presents via audio/video conferencing for a telehealth visit today.     she denies symptoms worrisome for COVID 19.  Patient has a history of diabetes and fibromyalgia.  She was referred by Dr. Allyson Sabal for evaluation of CHF.  For about 2 years, she had noted an elevated heart rate.  For over a year, she had noted dyspnea walking about 50 yards ("halfway around the grocery store").  She avoids stairs due to dyspnea.  She recalls an episode about 2 years ago when she developed severe chest pain and dyspnea but did not seek medical care.  She continues to work full time.  She does not drink ETOH or use drugs.  She has never been pregnant.  She has significant daytime sleepiness.   Cardiac MRI was done in 8/19 showing EF 20% with preserved RV function, no LGE.    Repeat echo was done in 11/19 showing EF 30% with normal RV size and systolic function.   Echo in 8/20 showed EF 35% with normal RV size and systolic function.    Repeat cardiac MRI in 9/20 showed EF 42%, normal RV with EF 53%, no LGE.   She is doing well, no significant dyspnea with her usual activities.  Continues to fatigue easily.  Doing to some walking and biking for exercise.  No lightheadedness.  No orthopnea/PND.  No chest pain.   Labs (5/19): K 4.8, creatinine 0.65 Labs (7/19): ANA negative, transferrin saturation 11%, SPEP negative.  Labs (8/19): K 3.8, creatinine 0.77 Labs (9/19): K 4.2, creatinine 0.57 Labs (10/19): hgb 14 Labs (2/20): K 4.3, creatinine 0.57 Labs (11/20): K 4.5, creatinine 0.50 Labs (3/21): K  4.1, creatinine 0.79  PMH: 1. Fibromyalgia 2. PCOS 3. Type II diabetes 4. Chronic systolic CHF: Nonischemic cardiomyopathy.  - Echo (4/19): Severe LV dilation with EF 20-25%, restrictive diastolic function.  - Coronary CTA (5/19): CAC 0, normal coronaries.  - Cardiac MRI (8/19): moderate LV dilation with EF 20%, normal RV size and systolic function, EF 60%.  No LGE.  - CPX (9/19): RER 1.03, peak VO2 19, VE/VCO2 slope 38 => submaximal study, moderately decreased functional capacity.  - Echo (11/19): EF 30%, normal RV size and systolic function, no MR.  - Echo (8/20): EF 35%, normal RV size and systolic function.  - Cardiac MRI (9/20): LV EF 42% with mild-moderate diffuse hypokinesis, RV EF 53%, no LGE.  5. Sinus tachycardia: Holter (5/19) with average HR 95, rare PVCs, no other arrhythmias.  6. OSA  7. Laparoscopic cholecystectomy 11/20.   SH: Rare ETOH, no smoking.  Married, no children or pregnancies.  Works full time in [employer].    FH: No history of CAD or cardiomyopathy.  ROS: All systems reviewed and negative except as per HPI.   Current Outpatient Medications  Medication Sig Dispense Refill  . Apple Cider Vinegar 500 MG TABS Take 500 mg by mouth daily.    . Ascorbic Acid (VITAMIN C) 1000 MG tablet Take 1,000 mg by mouth daily.    Marland Kitchen atorvastatin (LIPITOR) 40 MG tablet Take 40 mg by mouth daily.     Marland Kitchen  bisoprolol (ZEBETA) 10 MG tablet TAKE 1 TABLET BY MOUTH  DAILY (Patient taking differently: Take 10 mg by mouth daily. ) 90 tablet 3  . cholecalciferol (VITAMIN D3) 25 MCG (1000 UT) tablet Take 1,000 Units by mouth daily.    . clonazePAM (KLONOPIN) 0.5 MG tablet Take 0.5 mg by mouth 2 (two) times daily as needed for anxiety.    . empagliflozin (JARDIANCE) 25 MG TABS tablet Take 25 mg by mouth daily.    Marland Kitchen ENTRESTO 49-51 MG Take 1 tablet by mouth twice daily 180 tablet 3  . finasteride (PROSCAR) 5 MG tablet Take 2.5 mg by mouth daily.    . furosemide (LASIX) 20 MG tablet TAKE 1 TABLET  BY MOUTH 3  TIMES DAILY 270 tablet 3  . glimepiride (AMARYL) 1 MG tablet TAKE 1 TABLET BY MOUTH ONCE DAILY WITH BREAKFAST (Patient taking differently: Take 1 mg by mouth daily with breakfast. ) 90 tablet 0  . ivabradine (CORLANOR) 5 MG TABS tablet Take 1 tablet (5 mg total) by mouth 2 (two) times daily with a meal. 60 tablet 11  . KRILL OIL PO Take 750 mg by mouth daily.    Marland Kitchen lamoTRIgine (LAMICTAL) 25 MG tablet Take 1 tablet (25 mg total) by mouth 2 (two) times daily. 60 tablet 0  . levothyroxine (SYNTHROID, LEVOTHROID) 25 MCG tablet Take 25 mcg by mouth daily before breakfast.    . metFORMIN (GLUCOPHAGE) 1000 MG tablet Take 1,000 mg by mouth 2 (two) times daily with a meal.     . Multiple Vitamins-Minerals (HAIR SKIN AND NAILS FORMULA PO) Take 1 tablet by mouth 2 (two) times daily.     . norethindrone (MICRONOR,CAMILA,ERRIN) 0.35 MG tablet Take 1 tablet by mouth daily.    . Semaglutide, 1 MG/DOSE, (OZEMPIC, 1 MG/DOSE,) 2 MG/1.5ML SOPN Inject 1 mg into the skin every 7 (seven) days.    Marland Kitchen spironolactone (ALDACTONE) 25 MG tablet TAKE 1 TABLET BY MOUTH  DAILY 90 tablet 3  . Turmeric 500 MG CAPS Take 500 mg by mouth daily.    . vitamin B-12 (CYANOCOBALAMIN) 1000 MCG tablet Take 1,000 mcg by mouth daily.      No current facility-administered medications for this encounter.   Exam:  (Video/Tele Health Call; Exam is subjective and or/visual.) General:  Speaks in full sentences. No resp difficulty. Lungs: Normal respiratory effort with conversation.  Abdomen: Non-distended per patient report Extremities: Pt denies edema. Neuro: Alert & oriented x 3.   Assessment/Plan: 1. Chronic systolic CHF: Echo in 7/62 with severe LV dilation, EF 20-25%.  Coronary CT showed no evidence for CAD => nonischemic cardiomyopathy.  No prior pregnancies, so not peri-partum cardiomyopathy.  Mild sinus tachycardia, most recent holter with avg HR 95.  Do not think this is tachy-mediated CMP.  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  Cardiac MRI in 8/19 showed EF 20%, normal RV, no myocardial LGE.  CPX with moderate functional limitation from HF.  Echo in 11/19 showed EF 30%.  Echo in 8/20 showed EF 35%, borderline for ICD.  Cardiac MRI was then done in 9/20 for more accurate quantification of LV function, and EF found to be 42%.  She has NYHA class II symptoms, stable. - Continue Entresto 49/51 bid, can try to increase at next appointment in the office if BP is stable.   - Continue bisoprolol 10 mg daily.    - Continue spironolactone 25  mg daily.   - Continue Lasix 60 mg daily, I will arrange for BMET.  - She is on empagliflozin.  - Narrow QRS, would not be CRT candidate.  LV function now appears to be improved out of ICD range.  - Repeat echo at office followup.  2. OSA: She is using CPAP nightly.   COVID screen The patient does not have any symptoms that suggest any further testing/ screening at this time.  Social distancing reinforced today.  Patient Risk: After full review of this patients clinical status, I feel that they are at moderate risk for cardiac decompensation at this time.  Relevant cardiac medications were reviewed at length with the patient today. The patient does not have concerns regarding their medications at this time.   Recommended follow-up: 3 months with echo.   Today, I have spent 16 minutes with the patient with telehealth technology discussing the above issues .    Signed, Marca Ancona, MD  10/14/2019  [facility] [address] [facility] [place] [address] [phone] (office) [phone] (fax)

## 2019-10-15 ENCOUNTER — Ambulatory Visit (HOSPITAL_COMMUNITY)
Admission: RE | Admit: 2019-10-15 | Discharge: 2019-10-15 | Disposition: A | Discharge status: Home / Self Care | Payer: Commercial Managed Care - PPO | Source: Ambulatory Visit | Attending: Cardiology | Admitting: Cardiology

## 2019-10-15 ENCOUNTER — Other Ambulatory Visit: Payer: Self-pay

## 2019-10-15 DIAGNOSIS — I5022 Chronic systolic (congestive) heart failure: Secondary | ICD-10-CM | POA: Diagnosis present

## 2019-10-15 DIAGNOSIS — E785 Hyperlipidemia, unspecified: Secondary | ICD-10-CM | POA: Diagnosis not present

## 2019-10-15 LAB — BASIC METABOLIC PANEL
Anion gap: 10 (ref 5–15)
BUN: 13 mg/dL (ref 6–20)
CO2: 23 mmol/L (ref 22–32)
Calcium: 9.6 mg/dL (ref 8.9–10.3)
Chloride: 105 mmol/L (ref 98–111)
Creatinine, Ser: 0.64 mg/dL (ref 0.44–1.00)
GFR calc Af Amer: >60 mL/min (ref >60)
GFR calc non Af Amer: >60 mL/min (ref >60)
Glucose, Bld: 193 mg/dL — ABNORMAL HIGH (ref 70–99)
Potassium: 4 mmol/L (ref 3.5–5.1)
Sodium: 138 mmol/L (ref 135–145)

## 2019-10-15 LAB — LIPID PANEL
Cholesterol: 137 mg/dL (ref 0–200)
HDL: 35 mg/dL — ABNORMAL LOW (ref >40)
LDL Cholesterol: 80 mg/dL (ref 0–99)
Total CHOL/HDL Ratio: 3.9 RATIO
Triglycerides: 109 mg/dL (ref <150)
VLDL: 22 mg/dL (ref 0–40)

## 2019-10-15 NOTE — Addendum Note (Signed)
Encounter addended by: Theresia Bough, CMA on: 10/15/2019 10:29 AM  Actions taken: Visit diagnoses modified, Order list changed, Diagnosis association updated

## 2020-01-11 ENCOUNTER — Other Ambulatory Visit (HOSPITAL_COMMUNITY): Payer: Self-pay | Admitting: Cardiology

## 2020-01-20 ENCOUNTER — Encounter (HOSPITAL_COMMUNITY): Payer: Self-pay

## 2020-02-03 ENCOUNTER — Telehealth (HOSPITAL_COMMUNITY): Payer: Self-pay

## 2020-02-03 NOTE — Telephone Encounter (Signed)
[facility] to reschedule appt with dm nxt ava. is fine

## 2020-02-18 ENCOUNTER — Encounter (HOSPITAL_COMMUNITY): Payer: Commercial Managed Care - PPO | Admitting: Cardiology

## 2020-02-18 ENCOUNTER — Other Ambulatory Visit (HOSPITAL_COMMUNITY): Payer: Commercial Managed Care - PPO

## 2020-03-06 ENCOUNTER — Encounter (HOSPITAL_COMMUNITY): Payer: Self-pay

## 2020-03-07 NOTE — Telephone Encounter (Signed)
Lydia Taylor,   The magnesium is safe to take. However, I would not recommend the Floradix. The iron and the vitamins that are the main ingredients are fine, but it also includes other herb extracts that can have significant drug interactions. I would recommend she get a iron supplement without the other herbal extracts as that will be safer. She can take a multivitamin to get the other vitamin content.

## 2020-03-17 ENCOUNTER — Ambulatory Visit (HOSPITAL_BASED_OUTPATIENT_CLINIC_OR_DEPARTMENT_OTHER)
Admission: RE | Admit: 2020-03-17 | Discharge: 2020-03-17 | Disposition: A | Discharge status: Home / Self Care | Payer: Commercial Managed Care - PPO | Source: Ambulatory Visit | Attending: Cardiology | Admitting: Cardiology

## 2020-03-17 ENCOUNTER — Encounter (HOSPITAL_COMMUNITY): Payer: Self-pay | Admitting: Cardiology

## 2020-03-17 ENCOUNTER — Ambulatory Visit (HOSPITAL_COMMUNITY)
Admission: RE | Admit: 2020-03-17 | Discharge: 2020-03-17 | Disposition: A | Discharge status: Home / Self Care | Payer: Commercial Managed Care - PPO | Source: Ambulatory Visit | Attending: Cardiology | Admitting: Cardiology

## 2020-03-17 ENCOUNTER — Other Ambulatory Visit: Payer: Self-pay

## 2020-03-17 VITALS — BP 102/60 | HR 86 | Wt 169.0 lb

## 2020-03-17 DIAGNOSIS — G4733 Obstructive sleep apnea (adult) (pediatric): Secondary | ICD-10-CM | POA: Insufficient documentation

## 2020-03-17 DIAGNOSIS — I5022 Chronic systolic (congestive) heart failure: Secondary | ICD-10-CM | POA: Insufficient documentation

## 2020-03-17 LAB — ECHOCARDIOGRAM COMPLETE
Area-P 1/2: 3.42 cm2
Calc EF: 53.4 %
S' Lateral: 2.8 cm
Single Plane A2C EF: 55.4 %
Single Plane A4C EF: 50.5 %

## 2020-03-17 LAB — BASIC METABOLIC PANEL
Anion gap: 13 (ref 5–15)
BUN: 12 mg/dL (ref 6–20)
CO2: 23 mmol/L (ref 22–32)
Calcium: 9.7 mg/dL (ref 8.9–10.3)
Chloride: 102 mmol/L (ref 98–111)
Creatinine, Ser: 0.7 mg/dL (ref 0.44–1.00)
GFR, Estimated: >60 mL/min (ref >60)
Glucose, Bld: 248 mg/dL — ABNORMAL HIGH (ref 70–99)
Potassium: 3.9 mmol/L (ref 3.5–5.1)
Sodium: 138 mmol/L (ref 135–145)

## 2020-03-17 MED ORDER — FUROSEMIDE 40 MG PO TABS: 40.0000 mg | ORAL_TABLET | Freq: Every day | ORAL | 6 refills | Status: DC

## 2020-03-17 NOTE — Progress Notes (Signed)
Echocardiogram 2D Echocardiogram has been performed.  Lydia Taylor 03/17/2020, 2:30 PM

## 2020-03-17 NOTE — Patient Instructions (Signed)
Decrease Furosemide to 40mg  daily  Labs done today, your results will be available in MyChart, we will contact you for abnormal readings.   Call office to schedule appointment in April 2022  At the [facility], you and your health needs are our priority. As part of our continuing mission to provide you with exceptional heart care, we have created designated Provider Care Teams. These Care Teams include your primary Cardiologist (physician) and Advanced Practice Providers (APPs- Physician Assistants and Nurse Practitioners) who all work together to provide you with the care you need, when you need it.   You may see any of the following providers on your designated Care Team at your next follow up: May 2022 Dr Marland Kitchen . Dr Arvilla Meres . Marca Ancona, NP . Tonye Becket, PA . Robbie Lis, PharmD   Please be sure to bring in all your medications bottles to every appointment.

## 2020-03-19 NOTE — Progress Notes (Signed)
PCP:  Esperanza Richters, PA-C  Cardiologist:  Dr. Shirlee Latch   History of Present Illness: Lydia Taylor is a 44 y.o. female who has a history of diabetes and fibromyalgia.  She was referred by Dr. Allyson Sabal for evaluation of CHF.  For about 2 years, she had noted an elevated heart rate.  For over a year, she had noted dyspnea walking about 50 yards ("halfway around the grocery store").  She avoids stairs due to dyspnea.  She recalls an episode about 2 years ago when she developed severe chest pain and dyspnea but did not seek medical care.  She continues to work full time.  She does not drink ETOH or use drugs.  She has never been pregnant.  She has significant daytime sleepiness.   Cardiac MRI was done in 8/19 showing EF 20% with preserved RV function, no LGE.    Repeat echo was done in 11/19 showing EF 30% with normal RV size and systolic function.   Echo in 8/20 showed EF 35% with normal RV size and systolic function.    Repeat cardiac MRI in 9/20 showed EF 42%, normal RV with EF 53%, no LGE.   Echo was done today and reviewed, EF up to 50-55% with normal RV.   She can walk on flat ground without dyspnea.  No chest pain.  No orthopnea/PND. Stamina is poor but overall feeling good.  She is walking and plans to start playing tennis.   Labs (5/19): K 4.8, creatinine 0.65 Labs (7/19): ANA negative, transferrin saturation 11%, SPEP negative.  Labs (8/19): K 3.8, creatinine 0.77 Labs (9/19): K 4.2, creatinine 0.57 Labs (10/19): hgb 14 Labs (2/20): K 4.3, creatinine 0.57 Labs (11/20): K 4.5, creatinine 0.50 Labs (3/21): K 4.1, creatinine 0.79 Labs (6/21): K 4, creatinine 0.64, LDL 80  PMH: 1. Fibromyalgia 2. PCOS 3. Type II diabetes 4. Chronic systolic CHF: Nonischemic cardiomyopathy.  - Echo (4/19): Severe LV dilation with EF 20-25%, restrictive diastolic function.  - Coronary CTA (5/19): CAC 0, normal coronaries.  - Cardiac MRI (8/19): moderate LV dilation with EF 20%, normal RV size and  systolic function, EF 60%.  No LGE.  - CPX (9/19): RER 1.03, peak VO2 19, VE/VCO2 slope 38 => submaximal study, moderately decreased functional capacity.  - Echo (11/19): EF 30%, normal RV size and systolic function, no MR.  - Echo (8/20): EF 35%, normal RV size and systolic function.  - Cardiac MRI (9/20): LV EF 42% with mild-moderate diffuse hypokinesis, RV EF 53%, no LGE.  - Echo (11/21): EF 50-55%, normal RV, normal IVC.  5. Sinus tachycardia: Holter (5/19) with average HR 95, rare PVCs, no other arrhythmias.  6. OSA  7. Laparoscopic cholecystectomy 11/20.   SH: Rare ETOH, no smoking.  Married, no children or pregnancies.  Works full time in [employer].    FH: No history of CAD or cardiomyopathy.  ROS: All systems reviewed and negative except as per HPI.   Current Outpatient Medications  Medication Sig Dispense Refill  . Ascorbic Acid (VITAMIN C) 1000 MG tablet Take 1,000 mg by mouth daily.    Marland Kitchen atorvastatin (LIPITOR) 40 MG tablet Take 40 mg by mouth daily.     . bisoprolol (ZEBETA) 10 MG tablet TAKE 1 TABLET BY MOUTH  DAILY 90 tablet 3  . empagliflozin (JARDIANCE) 25 MG TABS tablet Take 25 mg by mouth daily.    Marland Kitchen ENTRESTO 49-51 MG Take 1 tablet by mouth twice daily 180 tablet 3  . finasteride (PROSCAR)  5 MG tablet Take 2.5 mg by mouth daily.    . furosemide (LASIX) 40 MG tablet Take 1 tablet (40 mg total) by mouth daily. 30 tablet 6  . glimepiride (AMARYL) 1 MG tablet TAKE 1 TABLET BY MOUTH ONCE DAILY WITH BREAKFAST (Patient taking differently: Take 1 mg by mouth daily with breakfast. ) 90 tablet 0  . ivabradine (CORLANOR) 5 MG TABS tablet Take 1 tablet (5 mg total) by mouth 2 (two) times daily with a meal. 60 tablet 11  . KRILL OIL PO Take 750 mg by mouth daily.    Marland Kitchen levothyroxine (SYNTHROID, LEVOTHROID) 25 MCG tablet Take 25 mcg by mouth daily before breakfast.    . metFORMIN (GLUCOPHAGE) 1000 MG tablet Take 1,000 mg by mouth 2 (two) times daily with a meal.     . Multiple  Vitamins-Minerals (HAIR SKIN AND NAILS FORMULA PO) Take 1 tablet by mouth 2 (two) times daily.     . norethindrone (MICRONOR,CAMILA,ERRIN) 0.35 MG tablet Take 1 tablet by mouth daily.    . Semaglutide, 1 MG/DOSE, (OZEMPIC, 1 MG/DOSE,) 2 MG/1.5ML SOPN Inject 1 mg into the skin every 7 (seven) days.    Marland Kitchen spironolactone (ALDACTONE) 25 MG tablet TAKE 1 TABLET BY MOUTH  DAILY 90 tablet 3  . Turmeric 500 MG CAPS Take 500 mg by mouth daily.    . vitamin B-12 (CYANOCOBALAMIN) 1000 MCG tablet Take 1,000 mcg by mouth daily.      No current facility-administered medications for this encounter.   Exam:  BP 102/60   Pulse 86   Wt 76.7 kg (169 lb)   SpO2 98%   BMI 29.94 kg/m  General: NAD Neck: No JVD, no thyromegaly or thyroid nodule.  Lungs: Clear to auscultation bilaterally with normal respiratory effort. CV: Nondisplaced PMI.  Heart regular S1/S2, no S3/S4, no murmur.  No peripheral edema.  No carotid bruit.  Normal pedal pulses.  Abdomen: Soft, nontender, no hepatosplenomegaly, no distention.  Skin: Intact without lesions or rashes.  Neurologic: Alert and oriented x 3.  Psych: Normal affect. Extremities: No clubbing or cyanosis.  HEENT: Normal.   Assessment/Plan: 1. Chronic systolic CHF: Echo in 5/19 with severe LV dilation, EF 20-25%.  Coronary CT showed no evidence for CAD => nonischemic cardiomyopathy.  No prior pregnancies, so not peri-partum cardiomyopathy.  Mild sinus tachycardia, most recent holter with avg HR 95.  Do not think this is tachy-mediated CMP.  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  Cardiac MRI in 8/19 showed EF 20%, normal RV, no myocardial LGE.  CPX with moderate functional limitation from HF.  Echo in 11/19 showed EF 30%.  Echo in 8/20 showed EF 35%, borderline for ICD.  Cardiac MRI was then done in 9/20 for more accurate quantification of LV function, and EF found to be 42%.  Echo today  with EF 50-55%, much improvement.  NYHA class I-II symptoms, not volume overloaded on exam.  - Continue Entresto 49/51 bid.   - Continue bisoprolol 10 mg daily.    - Continue spironolactone 25 mg daily.   - Decrease Lasix to 40 mg daily with goal to eventually stop. BMET today.  - She is on empagliflozin.  - LV function now improved out of ICD range.  2. OSA: She is using CPAP nightly.   Followup in 6 months.    Signed, Marca Ancona, MD  03/19/2020  [facility] [place] [address] [facility] [place] [address] [phone] (office) [phone] (fax)

## 2020-05-01 ENCOUNTER — Other Ambulatory Visit (HOSPITAL_COMMUNITY): Payer: Self-pay | Admitting: Cardiology

## 2020-07-04 ENCOUNTER — Other Ambulatory Visit (HOSPITAL_COMMUNITY): Payer: Self-pay | Admitting: Cardiology

## 2020-07-16 ENCOUNTER — Other Ambulatory Visit (HOSPITAL_COMMUNITY): Payer: Self-pay | Admitting: Cardiology

## 2020-07-18 ENCOUNTER — Telehealth (HOSPITAL_COMMUNITY): Payer: Self-pay | Admitting: Pharmacist

## 2020-07-18 NOTE — Telephone Encounter (Signed)
Patient Advocate Encounter   Received notification that prior authorization for Corlanor is required.   PA submitted via https://rxb.promptpa.com [id number] Status is pending   Will continue to follow.   Karle Plumber, PharmD, BCPS, BCCP, CPP [facility] Pharmacist [phone]

## 2020-07-20 ENCOUNTER — Encounter (HOSPITAL_COMMUNITY): Payer: Self-pay

## 2020-07-20 ENCOUNTER — Other Ambulatory Visit (HOSPITAL_COMMUNITY): Payer: Self-pay | Admitting: *Deleted

## 2020-07-21 NOTE — Telephone Encounter (Signed)
Sent additional clinical information to Rx Benefits so prior authorization for Corlanor can be completed.  Karle Plumber, PharmD, BCPS, BCCP, CPP [facility] Pharmacist [phone]

## 2020-07-24 NOTE — Telephone Encounter (Signed)
[facility] Patient Advocate Encounter  Prior Authorization for Corlanor has been approved.    PA# [id number] Effective dates: 07/21/20 through 07/20/21   Karle Plumber, PharmD, BCPS, BCCP, CPP [facility] Pharmacist [phone]

## 2020-07-26 ENCOUNTER — Other Ambulatory Visit (HOSPITAL_COMMUNITY): Payer: Self-pay | Admitting: Cardiology

## 2020-08-04 ENCOUNTER — Other Ambulatory Visit (HOSPITAL_COMMUNITY): Payer: Self-pay | Admitting: Cardiology

## 2020-10-06 ENCOUNTER — Encounter (HOSPITAL_COMMUNITY): Payer: Commercial Managed Care - PPO | Admitting: Cardiology

## 2020-11-05 ENCOUNTER — Other Ambulatory Visit (HOSPITAL_COMMUNITY): Payer: Self-pay | Admitting: Cardiology

## 2020-11-20 ENCOUNTER — Other Ambulatory Visit (HOSPITAL_COMMUNITY): Payer: Self-pay | Admitting: Cardiology

## 2020-11-29 ENCOUNTER — Ambulatory Visit (HOSPITAL_COMMUNITY)
Admission: RE | Admit: 2020-11-29 | Discharge: 2020-11-29 | Disposition: A | Discharge status: Home / Self Care | Payer: Commercial Managed Care - PPO | Source: Ambulatory Visit | Attending: Cardiology | Admitting: Cardiology

## 2020-11-29 ENCOUNTER — Encounter (HOSPITAL_COMMUNITY): Payer: Self-pay | Admitting: Cardiology

## 2020-11-29 ENCOUNTER — Other Ambulatory Visit: Payer: Self-pay

## 2020-11-29 VITALS — BP 118/70 | HR 99 | Wt 167.4 lb

## 2020-11-29 DIAGNOSIS — R000 Tachycardia, unspecified: Secondary | ICD-10-CM | POA: Insufficient documentation

## 2020-11-29 DIAGNOSIS — G4733 Obstructive sleep apnea (adult) (pediatric): Secondary | ICD-10-CM | POA: Diagnosis not present

## 2020-11-29 DIAGNOSIS — Z79899 Other long term (current) drug therapy: Secondary | ICD-10-CM | POA: Diagnosis not present

## 2020-11-29 DIAGNOSIS — Z7984 Long term (current) use of oral hypoglycemic drugs: Secondary | ICD-10-CM | POA: Diagnosis not present

## 2020-11-29 DIAGNOSIS — I429 Cardiomyopathy, unspecified: Secondary | ICD-10-CM | POA: Diagnosis not present

## 2020-11-29 DIAGNOSIS — I5022 Chronic systolic (congestive) heart failure: Secondary | ICD-10-CM | POA: Diagnosis not present

## 2020-11-29 LAB — BASIC METABOLIC PANEL
Anion gap: 12 (ref 5–15)
BUN: 11 mg/dL (ref 6–20)
CO2: 19 mmol/L — ABNORMAL LOW (ref 22–32)
Calcium: 9.4 mg/dL (ref 8.9–10.3)
Chloride: 107 mmol/L (ref 98–111)
Creatinine, Ser: 0.51 mg/dL (ref 0.44–1.00)
GFR, Estimated: >60 mL/min (ref >60)
Glucose, Bld: 165 mg/dL — ABNORMAL HIGH (ref 70–99)
Potassium: 4.2 mmol/L (ref 3.5–5.1)
Sodium: 138 mmol/L (ref 135–145)

## 2020-11-29 MED ORDER — FUROSEMIDE 20 MG PO TABS: 20.0000 mg | ORAL_TABLET | Freq: Every day | ORAL | 3 refills | Status: DC

## 2020-11-29 NOTE — Patient Instructions (Signed)
Labs done today. We will contact you only if your labs are abnormal.  DECREASE Lasix to 20mg  (1 tablet) by mouth daily.   No other medication changes were made. Please continue all current medications as prescribed.  Your physician recommends that you schedule a follow-up appointment in: November for an echo and an appointment for Dr. December following  Your physician has requested that you have an echocardiogram. Echocardiography is a painless test that uses sound waves to create images of your heart. It provides your doctor with information about the size and shape of your heart and how well your heart's chambers and valves are working. This procedure takes approximately one hour. There are no restrictions for this procedure.  If you have any questions or concerns before your next appointment please send Shirlee Latch a message through [facility] or call our office at [phone].    TO LEAVE A MESSAGE FOR THE NURSE SELECT OPTION 2, PLEASE LEAVE A MESSAGE INCLUDING: YOUR NAME DATE OF BIRTH CALL BACK NUMBER REASON FOR CALL**this is important as we prioritize the call backs  YOU WILL RECEIVE A CALL BACK THE SAME DAY AS LONG AS YOU CALL BEFORE 4:00 PM   Do the following things EVERYDAY: Weigh yourself in the morning before breakfast. Write it down and keep it in a log. Take your medicines as prescribed Eat low salt foods--Limit salt (sodium) to 2000 mg per day.  Stay as active as you can everyday Limit all fluids for the day to less than 2 liters   At the [facility], you and your health needs are our priority. As part of our continuing mission to provide you with exceptional heart care, we have created designated Provider Care Teams. These Care Teams include your primary Cardiologist (physician) and Advanced Practice Providers (APPs- Physician Assistants and Nurse Practitioners) who all work together to provide you with the care you need, when you need it.   You may see any of the  following providers on your designated Care Team at your next follow up: Dr [phone] Dr Arvilla Meres, NP Carron Curie, Robbie Lis [place], PharmD   Please be sure to bring in all your medications bottles to every appointment.

## 2020-11-30 NOTE — Progress Notes (Signed)
PCP:  Esperanza Richters, PA-C  Cardiologist:  Dr. Shirlee Latch   History of Present Illness: Lydia Taylor is a 45 y.o. female who has a history of diabetes and fibromyalgia.  She was referred by Dr. Allyson Sabal for evaluation of CHF.  For about 2 years, she had noted an elevated heart rate.  For over a year, she had noted dyspnea walking about 50 yards ("halfway around the grocery store").  She avoids stairs due to dyspnea.  She recalls an episode about 2 years ago when she developed severe chest pain and dyspnea but did not seek medical care.  She continues to work full time.  She does not drink ETOH or use drugs.  She has never been pregnant.  She has significant daytime sleepiness.   Cardiac MRI was done in 8/19 showing EF 20% with preserved RV function, no LGE.    Repeat echo was done in 11/19 showing EF 30% with normal RV size and systolic function.   Echo in 8/20 showed EF 35% with normal RV size and systolic function.    Repeat cardiac MRI in 9/20 showed EF 42%, normal RV with EF 53%, no LGE.   Echo in 11/21 showed EF up to 50-55% with normal RV.   Patient returns for followup of CHF.  She has moved to [facility] for her husband's work.  She is doing well, no dyspnea with exertion.  Weight down 2 lbs.  No lightheadedness.  No palpitations.  No orthopnea/PND.  No chest pain.  She is taking all her meds.    Labs (5/19): K 4.8, creatinine 0.65 Labs (7/19): ANA negative, transferrin saturation 11%, SPEP negative.  Labs (8/19): K 3.8, creatinine 0.77 Labs (9/19): K 4.2, creatinine 0.57 Labs (10/19): hgb 14 Labs (2/20): K 4.3, creatinine 0.57 Labs (11/20): K 4.5, creatinine 0.50 Labs (3/21): K 4.1, creatinine 0.79 Labs (6/21): K 4, creatinine 0.64, LDL 80 Labs (11/21): K 3.9, creatinine 0.7  PMH: 1. Fibromyalgia 2. PCOS 3. Type II diabetes 4. Chronic systolic CHF: Nonischemic cardiomyopathy.  - Echo (4/19): Severe LV dilation with EF 20-25%, restrictive diastolic function.  - Coronary CTA  (5/19): CAC 0, normal coronaries.  - Cardiac MRI (8/19): moderate LV dilation with EF 20%, normal RV size and systolic function, EF 60%.  No LGE.  - CPX (9/19): RER 1.03, peak VO2 19, VE/VCO2 slope 38 => submaximal study, moderately decreased functional capacity.  - Echo (11/19): EF 30%, normal RV size and systolic function, no MR.  - Echo (8/20): EF 35%, normal RV size and systolic function.  - Cardiac MRI (9/20): LV EF 42% with mild-moderate diffuse hypokinesis, RV EF 53%, no LGE.  - Echo (11/21): EF 50-55%, normal RV, normal IVC.  5. Sinus tachycardia: Holter (5/19) with average HR 95, rare PVCs, no other arrhythmias.  6. OSA  7. Laparoscopic cholecystectomy 11/20.   SH: Rare ETOH, no smoking.  Married, no children or pregnancies.  Works full time in [employer].    FH: No history of CAD or cardiomyopathy.  ROS: All systems reviewed and negative except as per HPI.   Current Outpatient Medications  Medication Sig Dispense Refill   Ascorbic Acid (VITAMIN C) 1000 MG tablet Take 1,000 mg by mouth daily.     atorvastatin (LIPITOR) 40 MG tablet Take 40 mg by mouth daily.      bisoprolol (ZEBETA) 10 MG tablet TAKE 1 TABLET BY MOUTH  DAILY 90 tablet 3   empagliflozin (JARDIANCE) 25 MG TABS tablet Take 25 mg by  mouth daily.     ENTRESTO 49-51 MG Take 1 tablet by mouth twice daily 180 tablet 0   glimepiride (AMARYL) 1 MG tablet TAKE 1 TABLET BY MOUTH ONCE DAILY WITH BREAKFAST 90 tablet 0   ivabradine (CORLANOR) 5 MG TABS tablet Take 1 tablet (5 mg total) by mouth 2 (two) times daily with a meal. Appt needed for further refills. Please call [phone] to make an appt 60 tablet 2   KRILL OIL PO Take 750 mg by mouth daily.     levothyroxine (SYNTHROID, LEVOTHROID) 25 MCG tablet Take 25 mcg by mouth daily before breakfast.     metFORMIN (GLUCOPHAGE) 1000 MG tablet Take 1,000 mg by mouth 2 (two) times daily with a meal.      Multiple Vitamins-Minerals (HAIR SKIN AND NAILS FORMULA PO) Take 1 tablet by  mouth 2 (two) times daily.      Semaglutide, 1 MG/DOSE, (OZEMPIC, 1 MG/DOSE,) 2 MG/1.5ML SOPN Inject 1 mg into the skin every 7 (seven) days.     spironolactone (ALDACTONE) 25 MG tablet TAKE 1 TABLET BY MOUTH  DAILY 90 tablet 3   Turmeric 500 MG CAPS Take 500 mg by mouth daily.     vitamin B-12 (CYANOCOBALAMIN) 1000 MCG tablet Take 1,000 mcg by mouth daily.      furosemide (LASIX) 20 MG tablet Take 1 tablet (20 mg total) by mouth daily. 90 tablet 3   No current facility-administered medications for this encounter.   Exam:  BP 118/70   Pulse 99   Wt 75.9 kg (167 lb 6.4 oz)   SpO2 99%   BMI 29.65 kg/m  General: NAD Neck: No JVD, no thyromegaly or thyroid nodule.  Lungs: Clear to auscultation bilaterally with normal respiratory effort. CV: Nondisplaced PMI.  Heart regular S1/S2, no S3/S4, no murmur.  No peripheral edema.  No carotid bruit.  Normal pedal pulses.  Abdomen: Soft, nontender, no hepatosplenomegaly, no distention.  Skin: Intact without lesions or rashes.  Neurologic: Alert and oriented x 3.  Psych: Normal affect. Extremities: No clubbing or cyanosis.  HEENT: Normal.   Assessment/Plan: 1. Chronic systolic CHF: Echo in 5/19 with severe LV dilation, EF 20-25%.  Coronary CT showed no evidence for CAD => nonischemic cardiomyopathy.  No prior pregnancies, so not peri-partum cardiomyopathy.  Mild sinus tachycardia, most recent holter with avg HR 95.  Do not think this is tachy-mediated CMP.  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  Cardiac MRI in 8/19 showed EF 20%, normal RV, no myocardial LGE.  CPX with moderate functional limitation from HF.  Echo in 11/19 showed EF 30%.  Echo in 8/20 showed EF 35%, borderline for ICD.  Cardiac MRI was then done in 9/20 for more accurate quantification of LV function, and EF found to be 42%.  Echo in 11/21 with EF 50-55%, much improvement.  NYHA class I-II symptoms,  not volume overloaded on exam.  - Continue Entresto 49/51 bid.   - Continue bisoprolol 10 mg daily.    - Continue spironolactone 25 mg daily.   - Decrease Lasix to 20 mg daily with goal to eventually stop. BMET today.  - She is on empagliflozin.  - I will arrange for repeat echo ion 11/22 to ensure that LV function remains improved.  2. OSA: She is using CPAP nightly.   Followup in 11/22 with echo.    Signed, Marca Ancona, MD  11/30/2020  [facility]  [place] [address] [phone] (office) [phone] (fax)

## 2020-12-13 ENCOUNTER — Other Ambulatory Visit (HOSPITAL_COMMUNITY): Payer: Self-pay | Admitting: Cardiology

## 2021-02-05 ENCOUNTER — Other Ambulatory Visit (HOSPITAL_COMMUNITY): Payer: Self-pay | Admitting: Cardiology

## 2021-02-25 ENCOUNTER — Other Ambulatory Visit (HOSPITAL_COMMUNITY): Payer: Self-pay | Admitting: Cardiology

## 2021-03-25 ENCOUNTER — Other Ambulatory Visit (HOSPITAL_COMMUNITY): Payer: Self-pay

## 2021-03-25 ENCOUNTER — Other Ambulatory Visit (HOSPITAL_COMMUNITY): Payer: Self-pay | Admitting: Cardiology

## 2021-03-30 ENCOUNTER — Ambulatory Visit (HOSPITAL_BASED_OUTPATIENT_CLINIC_OR_DEPARTMENT_OTHER)
Admission: RE | Admit: 2021-03-30 | Discharge: 2021-03-30 | Disposition: A | Discharge status: Home / Self Care | Payer: Commercial Managed Care - PPO | Source: Ambulatory Visit | Attending: Cardiology | Admitting: Cardiology

## 2021-03-30 ENCOUNTER — Ambulatory Visit (HOSPITAL_COMMUNITY)
Admission: RE | Admit: 2021-03-30 | Discharge: 2021-03-30 | Disposition: A | Discharge status: Home / Self Care | Payer: Commercial Managed Care - PPO | Source: Ambulatory Visit | Attending: Cardiology | Admitting: Cardiology

## 2021-03-30 ENCOUNTER — Encounter (HOSPITAL_COMMUNITY): Payer: Self-pay | Admitting: Cardiology

## 2021-03-30 ENCOUNTER — Other Ambulatory Visit: Payer: Self-pay

## 2021-03-30 VITALS — BP 90/60 | HR 108 | Wt 166.8 lb

## 2021-03-30 DIAGNOSIS — Z7901 Long term (current) use of anticoagulants: Secondary | ICD-10-CM | POA: Insufficient documentation

## 2021-03-30 DIAGNOSIS — I5022 Chronic systolic (congestive) heart failure: Secondary | ICD-10-CM | POA: Diagnosis not present

## 2021-03-30 DIAGNOSIS — E119 Type 2 diabetes mellitus without complications: Secondary | ICD-10-CM | POA: Insufficient documentation

## 2021-03-30 DIAGNOSIS — R000 Tachycardia, unspecified: Secondary | ICD-10-CM | POA: Diagnosis not present

## 2021-03-30 DIAGNOSIS — E785 Hyperlipidemia, unspecified: Secondary | ICD-10-CM | POA: Diagnosis not present

## 2021-03-30 DIAGNOSIS — R0789 Other chest pain: Secondary | ICD-10-CM | POA: Diagnosis present

## 2021-03-30 DIAGNOSIS — G4733 Obstructive sleep apnea (adult) (pediatric): Secondary | ICD-10-CM | POA: Insufficient documentation

## 2021-03-30 DIAGNOSIS — Z79899 Other long term (current) drug therapy: Secondary | ICD-10-CM | POA: Diagnosis not present

## 2021-03-30 DIAGNOSIS — M797 Fibromyalgia: Secondary | ICD-10-CM | POA: Insufficient documentation

## 2021-03-30 LAB — LIPID PANEL
Cholesterol: 137 mg/dL (ref 0–200)
HDL: 45 mg/dL (ref >40)
LDL Cholesterol: 60 mg/dL (ref 0–99)
Total CHOL/HDL Ratio: 3 RATIO
Triglycerides: 161 mg/dL — ABNORMAL HIGH (ref <150)
VLDL: 32 mg/dL (ref 0–40)

## 2021-03-30 LAB — BASIC METABOLIC PANEL
Anion gap: 12 (ref 5–15)
BUN: 6 mg/dL (ref 6–20)
CO2: 21 mmol/L — ABNORMAL LOW (ref 22–32)
Calcium: 9.3 mg/dL (ref 8.9–10.3)
Chloride: 104 mmol/L (ref 98–111)
Creatinine, Ser: 0.54 mg/dL (ref 0.44–1.00)
GFR, Estimated: >60 mL/min (ref >60)
Glucose, Bld: 240 mg/dL — ABNORMAL HIGH (ref 70–99)
Potassium: 4 mmol/L (ref 3.5–5.1)
Sodium: 137 mmol/L (ref 135–145)

## 2021-03-30 LAB — ECHOCARDIOGRAM COMPLETE
Area-P 1/2: 4.63 cm2
S' Lateral: 2.9 cm
Single Plane A2C EF: 53.7 %

## 2021-03-30 MED ORDER — FUROSEMIDE 20 MG PO TABS: 20.0000 mg | ORAL_TABLET | ORAL | 3 refills | Status: DC

## 2021-03-30 NOTE — Patient Instructions (Signed)
Take lasix 60mh every other day for a week, then stop if you feel ok.  Labs done today, your results will be available in MyChart, we will contact you for abnormal readings.  Your physician recommends that you return for lab work in: 3 months with your PCP.  Your physician recommends that you schedule a follow-up appointment in: 1 year (November 2023) ** Call in September 2023 for your appointment**  If you have any questions or concerns before your next appointment please send [place] a message through [place] or call our office at [phone].    TO LEAVE A MESSAGE FOR THE NURSE SELECT OPTION 2, PLEASE LEAVE A MESSAGE INCLUDING: YOUR NAME DATE OF BIRTH CALL BACK NUMBER REASON FOR CALL**this is important as we prioritize the call backs  YOU WILL RECEIVE A CALL BACK THE SAME DAY AS LONG AS YOU CALL BEFORE 4:00 PM  At the [facility], you and your health needs are our priority. As part of our continuing mission to provide you with exceptional heart care, we have created designated Provider Care Teams. These Care Teams include your primary Cardiologist (physician) and Advanced Practice Providers (APPs- Physician Assistants and Nurse Practitioners) who all work together to provide you with the care you need, when you need it.   You may see any of the following providers on your designated Care Team at your next follow up: Dr Arvilla Meres Dr Carron Curie, NP Robbie Lis, [place] [facility] [place], [place] Karle Plumber, PharmD   Please be sure to bring in all your medications bottles to every appointment.

## 2021-03-30 NOTE — Progress Notes (Signed)
  Echocardiogram 2D Echocardiogram has been performed.  Delcie Roch 03/30/2021, 1:55 PM

## 2021-04-01 NOTE — Progress Notes (Signed)
PCP:  Esperanza Richters, PA-C  Cardiologist:  Dr. Shirlee Latch   History of Present Illness: Lydia Taylor is a 45 y.o. female who has a history of diabetes and fibromyalgia.  She was referred by Dr. Allyson Sabal for evaluation of CHF.  For about 2 years, she had noted an elevated heart rate.  For over a year, she had noted dyspnea walking about 50 yards ("halfway around the grocery store").  She avoids stairs due to dyspnea.  She recalls an episode about 2 years ago when she developed severe chest pain and dyspnea but did not seek medical care.  She continues to work full time.  She does not drink ETOH or use drugs.  She has never been pregnant.  She has significant daytime sleepiness.   Cardiac MRI was done in 8/19 showing EF 20% with preserved RV function, no LGE.    Repeat echo was done in 11/19 showing EF 30% with normal RV size and systolic function.   Echo in 8/20 showed EF 35% with normal RV size and systolic function.    Repeat cardiac MRI in 9/20 showed EF 42%, normal RV with EF 53%, no LGE.   Echo in 11/21 showed EF up to 50-55% with normal RV.  Echo was done today and reviewed, EF 55% with normal RV.   Patient returns for followup of CHF.  She has moved to [facility] for her husband's work.  She has occasional chest tightness associated with emotional stress, not exertion.  No significant exertional dyspnea.  Weight is down 1 lb.  No orthopnea/PND.  No chest pain or lightheadedness.   ECG (personally reviewed): NSR, poor RWP rate 92 bpm  Labs (5/19): K 4.8, creatinine 0.65 Labs (7/19): ANA negative, transferrin saturation 11%, SPEP negative.  Labs (8/19): K 3.8, creatinine 0.77 Labs (9/19): K 4.2, creatinine 0.57 Labs (10/19): hgb 14 Labs (2/20): K 4.3, creatinine 0.57 Labs (11/20): K 4.5, creatinine 0.50 Labs (3/21): K 4.1, creatinine 0.79 Labs (6/21): K 4, creatinine 0.64, LDL 80 Labs (11/21): K 3.9, creatinine 0.7 Labs (7/22): K 4.2, creatinine 0.51  PMH: 1. Fibromyalgia 2.  PCOS 3. Type II diabetes 4. Chronic systolic CHF: Nonischemic cardiomyopathy.  - Echo (4/19): Severe LV dilation with EF 20-25%, restrictive diastolic function.  - Coronary CTA (5/19): CAC 0, normal coronaries.  - Cardiac MRI (8/19): moderate LV dilation with EF 20%, normal RV size and systolic function, EF 60%.  No LGE.  - CPX (9/19): RER 1.03, peak VO2 19, VE/VCO2 slope 38 => submaximal study, moderately decreased functional capacity.  - Echo (11/19): EF 30%, normal RV size and systolic function, no MR.  - Echo (8/20): EF 35%, normal RV size and systolic function.  - Cardiac MRI (9/20): LV EF 42% with mild-moderate diffuse hypokinesis, RV EF 53%, no LGE.  - Echo (11/21): EF 50-55%, normal RV, normal IVC.  - Echo (11/22): EF 55%, normal RV 5. Sinus tachycardia: Holter (5/19) with average HR 95, rare PVCs, no other arrhythmias.  6. OSA  7. Laparoscopic cholecystectomy 11/20.   SH: Rare ETOH, no smoking.  Married, no children or pregnancies.  Works full time in [employer].    FH: No history of CAD or cardiomyopathy.  ROS: All systems reviewed and negative except as per HPI.   Current Outpatient Medications  Medication Sig Dispense Refill   Ascorbic Acid (VITAMIN C) 1000 MG tablet Take 1,000 mg by mouth daily.     atorvastatin (LIPITOR) 40 MG tablet Take 40 mg by mouth daily.  bisoprolol (ZEBETA) 10 MG tablet TAKE 1 TABLET BY MOUTH  DAILY 90 tablet 3   empagliflozin (JARDIANCE) 25 MG TABS tablet Take 25 mg by mouth daily.     ENTRESTO 49-51 MG Take 1 tablet by mouth twice daily 180 tablet 0   FINASTERIDE PO Take 5 mg by mouth daily.     glimepiride (AMARYL) 2 MG tablet Take 2 mg by mouth daily with breakfast.     ivabradine (CORLANOR) 5 MG TABS tablet Take 1 tablet (5 mg total) by mouth 2 (two) times daily with a meal. 60 tablet 11   KRILL OIL PO Take 750 mg by mouth daily.     levothyroxine (SYNTHROID, LEVOTHROID) 25 MCG tablet Take 25 mcg by mouth daily before breakfast.      metFORMIN (GLUCOPHAGE) 1000 MG tablet Take 1,000 mg by mouth 2 (two) times daily with a meal.      Multiple Vitamins-Minerals (ONE DAILY MULTIVITAMIN WOMEN PO) Take 1 tablet by mouth daily. Hair skin and nails     Semaglutide, 1 MG/DOSE, (OZEMPIC, 1 MG/DOSE,) 2 MG/1.5ML SOPN Inject 1 mg into the skin every 7 (seven) days.     spironolactone (ALDACTONE) 25 MG tablet TAKE 1 TABLET BY MOUTH  DAILY 90 tablet 3   Turmeric 500 MG CAPS Take 500 mg by mouth daily.     vitamin B-12 (CYANOCOBALAMIN) 1000 MCG tablet Take 1,000 mcg by mouth daily.      furosemide (LASIX) 20 MG tablet Take 1 tablet (20 mg total) by mouth every other day. 90 tablet 3   No current facility-administered medications for this encounter.   Exam:  BP 90/60   Pulse (!) 108   Wt 75.7 kg (166 lb 12.8 oz)   SpO2 98%   BMI 29.55 kg/m  General: NAD Neck: No JVD, no thyromegaly or thyroid nodule.  Lungs: Clear to auscultation bilaterally with normal respiratory effort. CV: Nondisplaced PMI.  Heart regular S1/S2, no S3/S4, no murmur.  No peripheral edema.  No carotid bruit.  Normal pedal pulses.  Abdomen: Soft, nontender, no hepatosplenomegaly, no distention.  Skin: Intact without lesions or rashes.  Neurologic: Alert and oriented x 3.  Psych: Normal affect. Extremities: No clubbing or cyanosis.  HEENT: Normal.   Assessment/Plan: 1. Chronic systolic CHF: Echo in 5/19 with severe LV dilation, EF 20-25%.  Coronary CT showed no evidence for CAD => nonischemic cardiomyopathy.  No prior pregnancies, so not peri-partum cardiomyopathy.  Mild sinus tachycardia, most recent holter with avg HR 95.  Do not think this is tachy-mediated CMP.  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  Cardiac MRI in 8/19 showed EF 20%, normal RV, no myocardial LGE.  CPX with moderate functional limitation from HF.  Echo in 11/19 showed EF 30%.  Echo in 8/20 showed EF 35%,  borderline for ICD.  Cardiac MRI was then done in 9/20 for more accurate quantification of LV function, and EF found to be 42%.  Echo in 11/21 with EF 50-55%, much improvement. Echo today showed EF 55%, normal RV. NYHA class I-II symptoms, not volume overloaded on exam.  - Continue Entresto 49/51 bid.   - Continue bisoprolol 10 mg daily.    - Continue spironolactone 25 mg daily.  BMET today.  - Decrease Lasix to 20 mg every other day.  If no dyspnea, she can stop after a week or two.   - She is on empagliflozin.  2. OSA: She is using CPAP nightly.   Followup in 1 year but should get BMET every 3 months.   Signed, Marca Ancona, MD  04/01/2021  [facility] [place] [address] [facility] [place] [address] [phone] (office) [phone] (fax)

## 2021-05-02 ENCOUNTER — Other Ambulatory Visit (HOSPITAL_COMMUNITY): Payer: Self-pay | Admitting: Cardiology

## 2021-06-02 IMAGING — MR MR CARD MORPHOLOGY WO/W CM
19 of 21 series · 38 of 40 positions shown · IV contrast (gadavist)
Comparison: none

CLINICAL DATA: Cardiomyopathy of uncertain etiology

EXAM:
CARDIAC MRI
TECHNIQUE: The patient was scanned on a 1.5 Tesla GE magnet. A dedicated
cardiac coil was used. Functional imaging was done using Fiesta
sequences. [DATE], and 4 chamber views were done to assess for RWMA's.
Modified Locklear rule using a short axis stack was used to
calculate an ejection fraction on a dedicated work station using
Circle software. The patient received 8 cc of Gadavist. After 10
minutes inversion recovery sequences were used to assess for
infiltration and scar tissue.

[Series 6: bSSFP · oblique · 7.0mm · 1.34mm/px · 20 of 375 slices shown (1 of 4)]
[im 1/375]
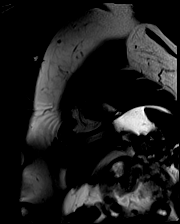
[im 20/375]
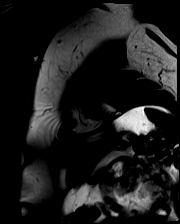
[im 40/375]
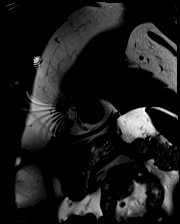
[im 60/375]
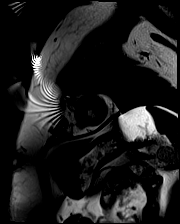
[im 79/375]
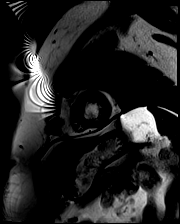
[im 99/375]
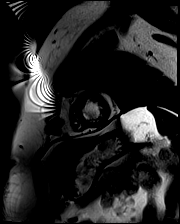
[im 119/375]
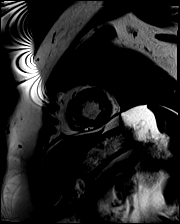
[im 138/375]
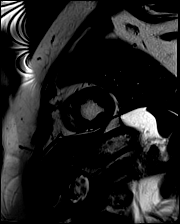
[im 158/375]
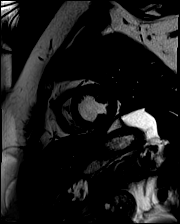
[im 178/375]
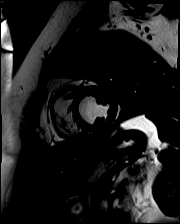
[im 197/375]
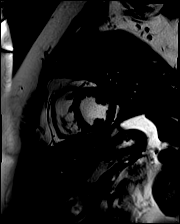
[im 217/375]
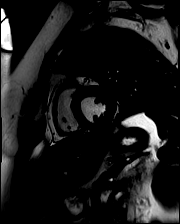
[im 237/375]
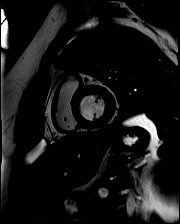
[im 256/375]
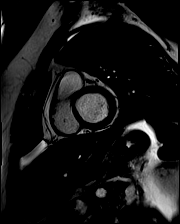
[im 276/375]
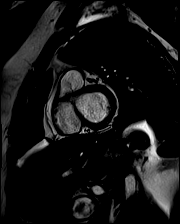
[im 296/375]
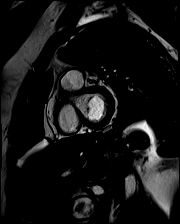
[im 315/375]
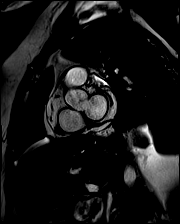
[im 335/375]
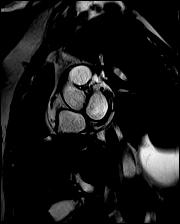
[im 355/375]
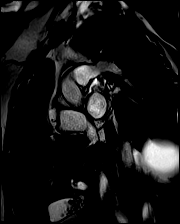
[im 375/375]
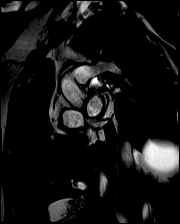

[Series 7: t2_stir_db_sax · oblique · 7.0mm · 1.44mm/px · 1 of 13 slices shown (1 of 2)]
[im 1/13]
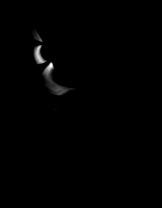

[Series 8: t2_stir_db_sax · oblique · 7.0mm · 1.54mm/px · 1 of 8 slices shown (2 of 2)]
[im 1/8]
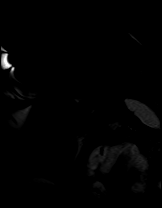

[Series 9: t2_stir_db_radial ((date)ch) · axial · 6.0mm · 1.54mm/px · 1 of 3 slices shown]
[im 1/3]
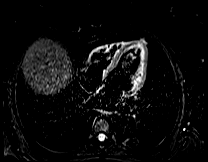

[Series 10: bSSFP · oblique · 6.0mm · 1.41mm/px · 1 of 25 slices shown (2 of 4)]
[im 1/25]
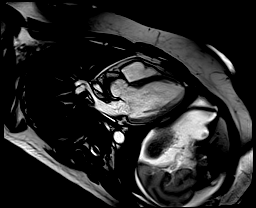

[Series 11: bSSFP · axial · 6.0mm · 1.41mm/px · 1 of 25 slices shown (3 of 4)]
[im 1/25]
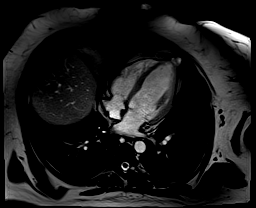

[Series 12: bSSFP · oblique · 6.0mm · 1.41mm/px · 1 of 25 slices shown (4 of 4)]
[im 1/25]
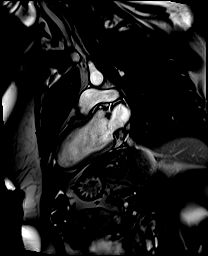

[Series 14: lge_single shot sa · oblique · 7.0mm · 1.67mm/px · 1 of 15 slices shown (1 of 4)]
[im 1/15]
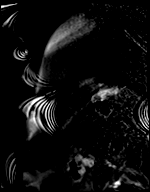

[Series 15: lge_single shot sa · oblique · 7.0mm · 1.67mm/px · 1 of 15 slices shown (2 of 4)]
[im 1/15]
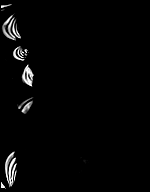

[Series 16: lge_single shot radial_mag · axial · 6.0mm · 1.98mm/px · 1 of 1 slices shown (1 of 2)]
[im 1/1]
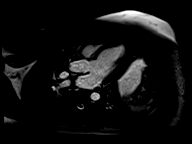

[Series 17: lge_single shot radial_psir · axial · 6.0mm · 1.98mm/px · 1 of 1 slices shown (1 of 2)]
[im 1/1]
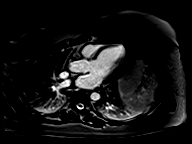

[Series 18: lge_single shot radial_mag · axial · 6.0mm · 1.98mm/px · 1 of 1 slices shown (2 of 2)]
[im 1/1]
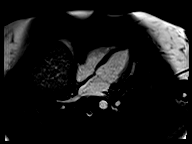

[Series 19: lge_single shot radial_psir · axial · 6.0mm · 1.98mm/px · 1 of 1 slices shown (2 of 2)]
[im 1/1]
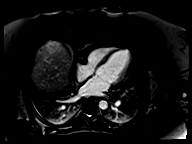

[Series 22: lge_single shot sa · oblique · 7.0mm · 1.67mm/px · 1 of 15 slices shown (3 of 4)]
[im 1/15]
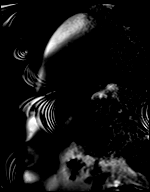

[Series 23: lge_single shot sa · oblique · 7.0mm · 1.67mm/px · 1 of 15 slices shown (4 of 4)]
[im 1/15]
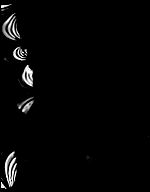

[Series 25: lge short axis_mag · oblique · 7.0mm · 1.43mm/px · 1 of 14 slices shown]
[im 1/14]
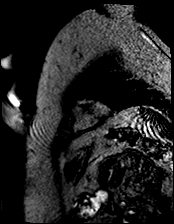

[Series 26: lge short axis_psir · oblique · 7.0mm · 1.43mm/px · 1 of 14 slices shown]
[im 1/14]
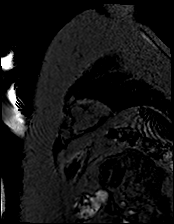

[Series 29: lge radial ((date)ch)_mag · axial · 6.0mm · 1.43mm/px · 1 of 1 slices shown]
[im 1/1]
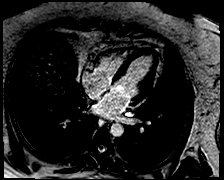

[Series 30: lge radial ((date)ch)_psir · axial · 6.0mm · 1.43mm/px · 1 of 1 slices shown]
[im 1/1]
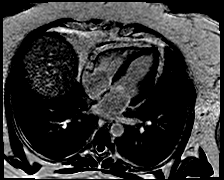

[38 of 40 positions shown; findings below may reference images not displayed]

FINDINGS: Limited images of the lung fields showed no gross abnormalities.

Normal left ventricular size with normal wall thickness. Diffuse
hypokinesis, EF 42%. Normal left and right atrial sizes. Normal
right ventricular size and systolic function, EF 53%. No significant
mitral regurgitation. No aortic stenosis or significant
regurgitation.

On delayed enhancement imaging, there was no myocardial late
gadolinium enhancement (LGE).

Measurements:

LVEDV 92 mL

LVSV 39 mL

LVEF 42%

RVEDV 64 mL

RVSV 34 mL

RVEF 53%
IMPRESSION: 1.  Normal LV size with EF 42%, mild-moderate diffuse hypokinesis.

2.  Normal RV size and systolic function, EF 53%.

3. No myocardial LGE, so no definitive evidence for prior MI,
myocarditis, or infiltrative disease.

Olimpia Tiger

## 2021-06-30 ENCOUNTER — Other Ambulatory Visit (HOSPITAL_COMMUNITY): Payer: Self-pay | Admitting: Cardiology

## 2021-10-04 ENCOUNTER — Telehealth (HOSPITAL_COMMUNITY): Payer: Self-pay

## 2021-10-04 NOTE — Telephone Encounter (Signed)
Received a medical clearance form from [facility] requesting Cardiology clearance for the following procedure: Colonoscopy. Medical clearance form was signed by Dr. Shirlee Latch and successfully faxed to [phone]. Medical clearance form will be scanned into patients chart.

## 2022-03-14 ENCOUNTER — Other Ambulatory Visit (HOSPITAL_COMMUNITY): Payer: Self-pay

## 2022-03-14 MED ORDER — IVABRADINE HCL 5 MG PO TABS: 5.0000 mg | ORAL_TABLET | Freq: Two times a day (BID) | ORAL | 0 refills | Status: DC

## 2022-03-19 ENCOUNTER — Other Ambulatory Visit (HOSPITAL_COMMUNITY): Payer: Self-pay

## 2022-03-19 MED ORDER — IVABRADINE HCL 5 MG PO TABS: 5.0000 mg | ORAL_TABLET | Freq: Two times a day (BID) | ORAL | 3 refills | Status: DC

## 2022-03-29 ENCOUNTER — Other Ambulatory Visit (HOSPITAL_COMMUNITY): Payer: Self-pay | Admitting: *Deleted

## 2022-03-29 DIAGNOSIS — I5022 Chronic systolic (congestive) heart failure: Secondary | ICD-10-CM

## 2022-04-03 ENCOUNTER — Ambulatory Visit (HOSPITAL_BASED_OUTPATIENT_CLINIC_OR_DEPARTMENT_OTHER)
Admission: RE | Admit: 2022-04-03 | Discharge: 2022-04-03 | Disposition: A | Discharge status: Home / Self Care | Payer: BC Managed Care – PPO | Source: Ambulatory Visit | Attending: Cardiology | Admitting: Cardiology

## 2022-04-03 ENCOUNTER — Ambulatory Visit (HOSPITAL_COMMUNITY)
Admission: RE | Admit: 2022-04-03 | Discharge: 2022-04-03 | Disposition: A | Discharge status: Home / Self Care | Payer: BC Managed Care – PPO | Source: Ambulatory Visit | Attending: Cardiology | Admitting: Cardiology

## 2022-04-03 ENCOUNTER — Encounter (HOSPITAL_COMMUNITY): Payer: Self-pay

## 2022-04-03 VITALS — BP 98/64 | HR 81 | Wt 146.4 lb

## 2022-04-03 DIAGNOSIS — I428 Other cardiomyopathies: Secondary | ICD-10-CM | POA: Insufficient documentation

## 2022-04-03 DIAGNOSIS — I5022 Chronic systolic (congestive) heart failure: Secondary | ICD-10-CM

## 2022-04-03 DIAGNOSIS — Z7985 Long-term (current) use of injectable non-insulin antidiabetic drugs: Secondary | ICD-10-CM | POA: Insufficient documentation

## 2022-04-03 DIAGNOSIS — Z79890 Hormone replacement therapy (postmenopausal): Secondary | ICD-10-CM | POA: Diagnosis not present

## 2022-04-03 DIAGNOSIS — E119 Type 2 diabetes mellitus without complications: Secondary | ICD-10-CM | POA: Diagnosis not present

## 2022-04-03 DIAGNOSIS — Z7984 Long term (current) use of oral hypoglycemic drugs: Secondary | ICD-10-CM | POA: Diagnosis not present

## 2022-04-03 DIAGNOSIS — R000 Tachycardia, unspecified: Secondary | ICD-10-CM | POA: Insufficient documentation

## 2022-04-03 DIAGNOSIS — E039 Hypothyroidism, unspecified: Secondary | ICD-10-CM | POA: Diagnosis not present

## 2022-04-03 DIAGNOSIS — M797 Fibromyalgia: Secondary | ICD-10-CM | POA: Insufficient documentation

## 2022-04-03 DIAGNOSIS — G4733 Obstructive sleep apnea (adult) (pediatric): Secondary | ICD-10-CM | POA: Diagnosis not present

## 2022-04-03 DIAGNOSIS — Z79899 Other long term (current) drug therapy: Secondary | ICD-10-CM | POA: Insufficient documentation

## 2022-04-03 LAB — ECHOCARDIOGRAM COMPLETE
AR max vel: 2.27 cm2
AV Area VTI: 2.48 cm2
AV Area mean vel: 2.2 cm2
AV Mean grad: 2 mmHg
AV Peak grad: 3.8 mmHg
Ao pk vel: 0.98 m/s
Area-P 1/2: 3.27 cm2
Calc EF: 61.6 %
S' Lateral: 3 cm
Single Plane A2C EF: 59.9 %
Single Plane A4C EF: 61 %

## 2022-04-03 NOTE — Addendum Note (Signed)
Encounter addended by: Suezanne Cheshire, RN on: 04/03/2022 3:51 PM  Actions taken: Flowsheet accepted, Clinical Note Signed, Charge Capture section accepted

## 2022-04-03 NOTE — Patient Instructions (Signed)
It was great to see you today! No medication changes are needed at this time.   Your physician wants you to follow-up in: 12 months with Dr Shirlee Latch and echo. You will receive a reminder letter in the mail two months in advance. If you don't receive a letter, please call our office to schedule the follow-up appointment.    Your physician has requested that you have an echocardiogram. Echocardiography is a painless test that uses sound waves to create images of your heart. It provides your doctor with information about the size and shape of your heart and how well your heart's chambers and valves are working. This procedure takes approximately one hour. There are no restrictions for this procedure. Please do NOT wear cologne, perfume, aftershave, or lotions (deodorant is allowed). Please arrive 15 minutes prior to your appointment time.   At the [facility], you and your health needs are our priority. As part of our continuing mission to provide you with exceptional heart care, we have created designated Provider Care Teams. These Care Teams include your primary Cardiologist (physician) and Advanced Practice Providers (APPs- Physician Assistants and Nurse Practitioners) who all work together to provide you with the care you need, when you need it.   You may see any of the following providers on your designated Care Team at your next follow up: Dr Arvilla Meres Dr Marca Ancona Dr. Marcos Eke, NP Robbie Lis, [place] [facility] [place], [place] Brynda Peon, NP Karle Plumber, PharmD   Please be sure to bring in all your medications bottles to every appointment.

## 2022-04-03 NOTE — Progress Notes (Signed)
*  PRELIMINARY RESULTS* Echocardiogram 2D Echocardiogram has been performed.  Lydia Taylor 04/03/2022, 2:46 PM

## 2022-04-03 NOTE — Progress Notes (Signed)
[facility] Progress Note   PCP:  Esperanza Richters, PA-C  Cardiologist:  Dr. Shirlee Latch   History of Present Illness: Lydia Taylor is a 46 y.o. female who has a history of diabetes and fibromyalgia.  She was referred by Dr. Allyson Sabal for evaluation of CHF.  For about 2 years, she had noted an elevated heart rate.  For over a year, she had noted dyspnea walking about 50 yards ("halfway around the grocery store").  She avoids stairs due to dyspnea.  She recalls an episode about 2 years ago when she developed severe chest pain and dyspnea but did not seek medical care.  She continues to work full time.  She does not drink ETOH or use drugs.  She has never been pregnant.  She has significant daytime sleepiness.   Cardiac MRI was done in 8/19 showing EF 20% with preserved RV function, no LGE.    Repeat echo was done in 11/19 showing EF 30% with normal RV size and systolic function.   Echo in 8/20 showed EF 35% with normal RV size and systolic function.    Repeat cardiac MRI in 9/20 showed EF 42%, normal RV with EF 53%, no LGE.   Echo in 11/21 showed EF up to 50-55% with normal RV.  Echo 11/22 EF 55% with normal RV.   She presents today for annual f/u and echo. Here w/ husband. Feels well. No significant health changes since last visit. No significant dyspnea w/ most ADLs. Only SOB if she over exerts. No ischemic CP. Reports chronic fatigue but attributes this to fibromyalgia. No LEE. Takes lasix PRN. BP 98/64 c/w usual baseline. No orthostatic symptoms. ReDs 32%.    Labs (5/19): K 4.8, creatinine 0.65 Labs (7/19): ANA negative, transferrin saturation 11%, SPEP negative.  Labs (8/19): K 3.8, creatinine 0.77 Labs (9/19): K 4.2, creatinine 0.57 Labs (10/19): hgb 14 Labs (2/20): K 4.3, creatinine 0.57 Labs (11/20): K 4.5, creatinine 0.50 Labs (3/21): K 4.1, creatinine 0.79 Labs (6/21): K 4, creatinine 0.64, LDL 80 Labs (11/21): K 3.9, creatinine 0.7 Labs (7/22): K 4.2,  creatinine 0.51 Labs (8/23): K 3.6, creatinine 0.41   PMH: 1. Fibromyalgia 2. PCOS 3. Type II diabetes 4. Chronic systolic CHF: Nonischemic cardiomyopathy.  - Echo (4/19): Severe LV dilation with EF 20-25%, restrictive diastolic function.  - Coronary CTA (5/19): CAC 0, normal coronaries.  - Cardiac MRI (8/19): moderate LV dilation with EF 20%, normal RV size and systolic function, EF 60%.  No LGE.  - CPX (9/19): RER 1.03, peak VO2 19, VE/VCO2 slope 38 => submaximal study, moderately decreased functional capacity.  - Echo (11/19): EF 30%, normal RV size and systolic function, no MR.  - Echo (8/20): EF 35%, normal RV size and systolic function.  - Cardiac MRI (9/20): LV EF 42% with mild-moderate diffuse hypokinesis, RV EF 53%, no LGE.  - Echo (11/21): EF 50-55%, normal RV, normal IVC.  - Echo (11/22): EF 55%, normal RV 5. Sinus tachycardia: Holter (5/19) with average HR 95, rare PVCs, no other arrhythmias.  6. OSA  7. Laparoscopic cholecystectomy 11/20.   SH: Rare ETOH, no smoking.  Married, no children or pregnancies.  Works full time in [employer].    FH: No history of CAD or cardiomyopathy.  ROS: All systems reviewed and negative except as per HPI.   Current Outpatient Medications  Medication Sig Dispense Refill   Ascorbic Acid (VITAMIN C) 1000 MG tablet Take 1,000 mg by mouth daily.  atorvastatin (LIPITOR) 40 MG tablet Take 40 mg by mouth daily.      bisoprolol (ZEBETA) 10 MG tablet TAKE 1 TABLET BY MOUTH  DAILY 90 tablet 3   empagliflozin (JARDIANCE) 25 MG TABS tablet Take 25 mg by mouth daily.     glimepiride (AMARYL) 2 MG tablet Take 4 mg by mouth daily with breakfast.     ivabradine (CORLANOR) 5 MG TABS tablet Take 1 tablet (5 mg total) by mouth 2 (two) times daily with a meal. 60 tablet 3   levothyroxine (SYNTHROID, LEVOTHROID) 25 MCG tablet Take 25 mcg by mouth daily before breakfast.     metFORMIN (GLUCOPHAGE) 1000 MG tablet Take 1,000 mg by mouth 2 (two) times daily with a  meal.      Multiple Vitamins-Minerals (ONE DAILY MULTIVITAMIN WOMEN PO) Take 1 tablet by mouth daily. Hair skin and nails     sacubitril-valsartan (ENTRESTO) 49-51 MG Take 1 tablet by mouth 2 (two) times daily. 180 tablet 3   Semaglutide, 1 MG/DOSE, (OZEMPIC, 1 MG/DOSE,) 2 MG/1.5ML SOPN Inject 2 mg into the skin every 7 (seven) days.     spironolactone (ALDACTONE) 25 MG tablet TAKE 1 TABLET BY MOUTH  DAILY 90 tablet 3   No current facility-administered medications for this encounter.   Exam:  BP 98/64   Pulse 81   Wt 66.4 kg (146 lb 6.4 oz)   SpO2 100%   BMI 25.93 kg/m  ReDS 33%  General:  Well appearing. No respiratory difficulty HEENT: normal Neck: supple. no JVD. Carotids 2+ bilat; no bruits. No lymphadenopathy or thyromegaly appreciated. Cor: PMI nondisplaced. Regular rate & rhythm. No rubs, gallops or murmurs. Lungs: clear Abdomen: soft, nontender, nondistended. No hepatosplenomegaly. No bruits or masses. Good bowel sounds. Extremities: no cyanosis, clubbing, rash, edema Neuro: alert & oriented x 3, cranial nerves grossly intact. moves all 4 extremities w/o difficulty. Affect pleasant.   Assessment/Plan: 1. H/o Systolic CHF>>HFimEF: Echo in 9/38 with severe LV dilation, EF 20-25%.  Coronary CT showed no evidence for CAD => nonischemic cardiomyopathy.  No prior pregnancies, so not peri-partum cardiomyopathy.  Mild sinus tachycardia, most recent holter with avg HR 95.  Do not think this is tachy-mediated CMP.  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  Cardiac MRI in 8/19 showed EF 20%, normal RV, no myocardial LGE.  CPX with moderate functional limitation from HF.  Echo in 11/19 showed EF 30%.  Echo in 8/20 showed EF 35%, borderline for ICD.  Cardiac MRI was then done in 9/20 for more accurate quantification of LV function, and EF found to be 42%.  Echo in 11/21 with EF 50-55%, much  improvement. 11/22 showed EF 55%, normal RV.  Echo repeated again today, physician read pending.  - NYHA Class I-II symptoms. Euvolemic on exam. ReDs 32% - Does not need daily loop diuertic, take Lasix PRN  - Soft BP limits med titration  - Continue Entresto 49/51 bid - Continue bisoprolol 10 mg daily.    - Continue spironolactone 25 mg daily.   - Continue Jardiance (25 mg dose for DM) - PCP follows labs. Recently done. Personally reviewed, SCr and K stable  2. OSA: She is using CPAP nightly.  3. Hypothyroidism: on levothyroxine - followed by PCP 4. Type 2DM: Most recent Hgb A1c 8.7 - on Jardiance + Semaglutide - on statin  - followed by PCP   F/u again in 1  year w/ repeat echo for annual surveillance   Signed, Knute Neu  04/03/2022  [facility] [address] [facility] [place] [address] [phone] (office) [phone] (fax)

## 2022-04-03 NOTE — Progress Notes (Signed)
ReDS Vest / Clip - 04/03/22 1500       ReDS Vest / Clip   Station Marker A    Ruler Value 31    ReDS Value Range Low volume    ReDS Actual Value 32

## 2022-04-03 NOTE — Addendum Note (Signed)
Encounter addended by: Allayne Butcher, PA-C on: 04/03/2022 3:42 PM  Actions taken: Level of Service modified, Visit diagnoses modified, Diagnosis association updated, Flowsheet accepted

## 2022-04-16 ENCOUNTER — Other Ambulatory Visit (HOSPITAL_COMMUNITY): Payer: Self-pay | Admitting: *Deleted

## 2022-04-16 MED ORDER — SPIRONOLACTONE 25 MG PO TABS: 25.0000 mg | ORAL_TABLET | Freq: Every day | ORAL | 3 refills | Status: DC

## 2022-05-16 ENCOUNTER — Other Ambulatory Visit (HOSPITAL_COMMUNITY): Payer: Self-pay

## 2022-05-17 ENCOUNTER — Other Ambulatory Visit (HOSPITAL_COMMUNITY): Payer: Self-pay

## 2022-05-20 ENCOUNTER — Other Ambulatory Visit (HOSPITAL_COMMUNITY): Payer: Self-pay | Admitting: *Deleted

## 2022-05-20 MED ORDER — ENTRESTO 49-51 MG PO TABS: 1.0000 | ORAL_TABLET | Freq: Two times a day (BID) | ORAL | 3 refills | Status: DC

## 2022-05-21 ENCOUNTER — Other Ambulatory Visit (HOSPITAL_COMMUNITY): Payer: Self-pay

## 2022-05-21 MED ORDER — ENTRESTO 49-51 MG PO TABS: 1.0000 | ORAL_TABLET | Freq: Two times a day (BID) | ORAL | 3 refills | Status: DC

## 2022-08-11 ENCOUNTER — Other Ambulatory Visit (HOSPITAL_COMMUNITY): Payer: Self-pay | Admitting: Cardiology

## 2022-09-23 ENCOUNTER — Telehealth (HOSPITAL_COMMUNITY): Payer: Self-pay

## 2022-09-23 ENCOUNTER — Encounter (HOSPITAL_COMMUNITY): Payer: Self-pay | Admitting: Cardiology

## 2022-09-23 ENCOUNTER — Other Ambulatory Visit (HOSPITAL_COMMUNITY): Payer: Self-pay

## 2022-09-23 NOTE — Telephone Encounter (Signed)
Advanced Heart Failure Patient Advocate Encounter  Prior authorization for Corlanor has been submitted and approved. Test billing returns $35 for 30 day supply.  Key: [id number] Effective: 09/23/2022 to 09/23/2023  Burnell Blanks, CPhT Rx Patient Advocate Phone: [phone]

## 2022-11-17 ENCOUNTER — Encounter (HOSPITAL_COMMUNITY): Payer: Self-pay | Admitting: Cardiology

## 2022-11-18 ENCOUNTER — Telehealth (HOSPITAL_COMMUNITY): Payer: Self-pay

## 2022-11-18 ENCOUNTER — Other Ambulatory Visit (HOSPITAL_COMMUNITY): Payer: Self-pay

## 2022-11-18 NOTE — Telephone Encounter (Signed)
Advanced Heart Failure Patient Advocate Encounter  Prior authorization for Sherryll Burger has been submitted and approved. Test billing returns $165 for 90 day supply. Spoke to patient by phone, confirmed she does have copay/savings card.  Key: [id number] Effective: 12/18/2022 to 11/18/2023  Burnell Blanks, CPhT Rx Patient Advocate Phone: [phone]

## 2022-12-24 ENCOUNTER — Encounter (HOSPITAL_COMMUNITY): Payer: Self-pay | Admitting: Cardiology

## 2022-12-24 ENCOUNTER — Other Ambulatory Visit (HOSPITAL_COMMUNITY): Payer: Self-pay | Admitting: Cardiology

## 2022-12-24 NOTE — Telephone Encounter (Signed)
Message to pharmacy team

## 2022-12-25 ENCOUNTER — Telehealth (HOSPITAL_COMMUNITY): Payer: Self-pay | Admitting: Pharmacy Technician

## 2022-12-25 ENCOUNTER — Other Ambulatory Visit (HOSPITAL_COMMUNITY): Payer: Self-pay

## 2022-12-25 NOTE — Telephone Encounter (Signed)
Advanced Heart Failure Patient Advocate Encounter  Received a message regarding patient's insurance and not being able to get medications. When I run an eligibility check, multiple insurance plans come up which are both paying for the medications. Called and left the patient a message. Want to be sure which plan is active to give best advise.  Archer Asa, CPhT

## 2022-12-27 ENCOUNTER — Other Ambulatory Visit (HOSPITAL_COMMUNITY): Payer: Self-pay

## 2022-12-27 ENCOUNTER — Telehealth (HOSPITAL_COMMUNITY): Payer: Self-pay

## 2022-12-27 ENCOUNTER — Other Ambulatory Visit (HOSPITAL_COMMUNITY): Payer: Self-pay | Admitting: Cardiology

## 2022-12-27 NOTE — Telephone Encounter (Signed)
Advanced Heart Failure Patient Advocate Encounter  Patient left message stating she has an [place] plan now. I do not see an [place] plan upon eligibility search. Called and left the patient a message. Hopefully I can get her to send a copy of the card soon.

## 2022-12-27 NOTE — Telephone Encounter (Signed)
Advanced Heart Failure Patient Advocate Encounter  Prior authorization for Ivabradine (Corlanor) has been submitted and approved. Test billing returns $15 for 30 day supply.  Key: [id number] Effective: 12/12/2022 to 12/27/2023  Burnell Blanks, CPhT Rx Patient Advocate Phone: [phone]

## 2022-12-27 NOTE — Telephone Encounter (Signed)
Advanced Heart Failure Patient Advocate Encounter  Prior authorization for [place] Pepper has been submitted and approved. Test billing returns $29.99 for 90 day supply.  Key: [id number] Effective: 12/12/2022 to 12/27/2023  Burnell Blanks, CPhT Rx Patient Advocate Phone: [phone]

## 2022-12-30 ENCOUNTER — Other Ambulatory Visit (HOSPITAL_COMMUNITY): Payer: Self-pay

## 2023-01-22 ENCOUNTER — Other Ambulatory Visit: Payer: Self-pay | Admitting: *Deleted

## 2023-01-22 MED ORDER — SPIRONOLACTONE 25 MG PO TABS: 25.0000 mg | ORAL_TABLET | Freq: Every day | ORAL | 3 refills | Status: DC

## 2023-01-27 ENCOUNTER — Encounter (HOSPITAL_COMMUNITY): Payer: Self-pay | Admitting: Cardiology

## 2023-01-28 MED ORDER — SPIRONOLACTONE 25 MG PO TABS: 25.0000 mg | ORAL_TABLET | Freq: Every day | ORAL | 3 refills | Status: DC

## 2023-01-28 MED ORDER — IVABRADINE HCL 5 MG PO TABS: 5.0000 mg | ORAL_TABLET | Freq: Two times a day (BID) | ORAL | 3 refills | Status: DC

## 2023-01-28 MED ORDER — BISOPROLOL FUMARATE 10 MG PO TABS: 10.0000 mg | ORAL_TABLET | Freq: Every day | ORAL | 3 refills | Status: DC

## 2023-03-24 ENCOUNTER — Ambulatory Visit (HOSPITAL_BASED_OUTPATIENT_CLINIC_OR_DEPARTMENT_OTHER)
Admission: RE | Admit: 2023-03-24 | Discharge: 2023-03-24 | Disposition: A | Discharge status: Home / Self Care | Payer: 59 | Source: Ambulatory Visit | Attending: Cardiology | Admitting: Cardiology

## 2023-03-24 ENCOUNTER — Other Ambulatory Visit: Payer: Self-pay

## 2023-03-24 ENCOUNTER — Ambulatory Visit (HOSPITAL_COMMUNITY)
Admission: RE | Admit: 2023-03-24 | Discharge: 2023-03-24 | Disposition: A | Discharge status: Home / Self Care | Payer: 59 | Source: Ambulatory Visit | Attending: Cardiology | Admitting: Cardiology

## 2023-03-24 VITALS — BP 102/85 | HR 89 | Wt 148.0 lb

## 2023-03-24 DIAGNOSIS — E119 Type 2 diabetes mellitus without complications: Secondary | ICD-10-CM | POA: Insufficient documentation

## 2023-03-24 DIAGNOSIS — Z7984 Long term (current) use of oral hypoglycemic drugs: Secondary | ICD-10-CM | POA: Insufficient documentation

## 2023-03-24 DIAGNOSIS — I5032 Chronic diastolic (congestive) heart failure: Secondary | ICD-10-CM

## 2023-03-24 DIAGNOSIS — Z7985 Long-term (current) use of injectable non-insulin antidiabetic drugs: Secondary | ICD-10-CM | POA: Insufficient documentation

## 2023-03-24 DIAGNOSIS — I5022 Chronic systolic (congestive) heart failure: Secondary | ICD-10-CM

## 2023-03-24 DIAGNOSIS — E039 Hypothyroidism, unspecified: Secondary | ICD-10-CM | POA: Diagnosis not present

## 2023-03-24 DIAGNOSIS — G4733 Obstructive sleep apnea (adult) (pediatric): Secondary | ICD-10-CM | POA: Diagnosis not present

## 2023-03-24 DIAGNOSIS — Z79899 Other long term (current) drug therapy: Secondary | ICD-10-CM | POA: Diagnosis not present

## 2023-03-24 DIAGNOSIS — R000 Tachycardia, unspecified: Secondary | ICD-10-CM | POA: Insufficient documentation

## 2023-03-24 LAB — COMPREHENSIVE METABOLIC PANEL
ALT: 241 U/L — ABNORMAL HIGH (ref 0–44)
AST: 219 U/L — ABNORMAL HIGH (ref 15–41)
Albumin: 3.6 g/dL (ref 3.5–5.0)
Alkaline Phosphatase: 125 U/L (ref 38–126)
Anion gap: 7 (ref 5–15)
BUN: 9 mg/dL (ref 6–20)
CO2: 22 mmol/L (ref 22–32)
Calcium: 9.6 mg/dL (ref 8.9–10.3)
Chloride: 108 mmol/L (ref 98–111)
Creatinine, Ser: 0.58 mg/dL (ref 0.44–1.00)
GFR, Estimated: >60 mL/min (ref >60)
Glucose, Bld: 258 mg/dL — ABNORMAL HIGH (ref 70–99)
Potassium: 4.4 mmol/L (ref 3.5–5.1)
Sodium: 137 mmol/L (ref 135–145)
Total Bilirubin: 0.7 mg/dL (ref <1.2)
Total Protein: 7.9 g/dL (ref 6.5–8.1)

## 2023-03-24 LAB — LIPID PANEL
Cholesterol: 161 mg/dL (ref 0–200)
HDL: 50 mg/dL (ref >40)
LDL Cholesterol: 97 mg/dL (ref 0–99)
Total CHOL/HDL Ratio: 3.2 {ratio}
Triglycerides: 70 mg/dL (ref <150)
VLDL: 14 mg/dL (ref 0–40)

## 2023-03-24 NOTE — Progress Notes (Signed)
  Echocardiogram 2D Echocardiogram has been performed.  Lydia Taylor 03/24/2023, 10:43 AM

## 2023-03-24 NOTE — Patient Instructions (Addendum)
STOP Corlanor, if your heart goes up above 100 and stays there on your fitbit, then restart it.  Labs done today, your results will be available in MyChart, we will contact you for abnormal readings.  Restart your CPAP  Your physician recommends that you schedule a follow-up appointment in: 1 year ( November 20205) ** PLEASE CALL THE OFFICE IN SEPTEMBER 2025 TO ARRANGE YOUR FOLLOW UP APPOINTMENT. **  If you have any questions or concerns before your next appointment please send [place] a message through [place] or call our office at [phone].    TO LEAVE A MESSAGE FOR THE NURSE SELECT OPTION 2, PLEASE LEAVE A MESSAGE INCLUDING: YOUR NAME DATE OF BIRTH CALL BACK NUMBER REASON FOR CALL**this is important as we prioritize the call backs  YOU WILL RECEIVE A CALL BACK THE SAME DAY AS LONG AS YOU CALL BEFORE 4:00 PM  At the [facility], you and your health needs are our priority. As part of our continuing mission to provide you with exceptional heart care, we have created designated Provider Care Teams. These Care Teams include your primary Cardiologist (physician) and Advanced Practice Providers (APPs- Physician Assistants and Nurse Practitioners) who all work together to provide you with the care you need, when you need it.   You may see any of the following providers on your designated Care Team at your next follow up: Dr Arvilla Meres Dr Marca Ancona Dr. Dorthula Nettles Dr. Clearnce Hasten Amy Filbert Schilder, NP Robbie Lis, [place] [facility] [place], [place] Brynda Peon, NP [place] Lee, NP Karle Plumber, PharmD   Please be sure to bring in all your medications bottles to every appointment.    Thank you for choosing [facility]

## 2023-03-24 NOTE — Progress Notes (Signed)
[facility] Failure Clinic Progress Note   PCP:  Esperanza Richters, PA-C  Cardiologist:  Dr. Shirlee Latch   History of Present Illness: Lydia Taylor is a 47 y.o. female who has a history of diabetes and fibromyalgia.  She was referred by Dr. Allyson Sabal for evaluation of CHF.  For about 2 years, she had noted an elevated heart rate.  For over a year, she had noted dyspnea walking about 50 yards ("halfway around the grocery store").  She avoids stairs due to dyspnea.  She recalls an episode about 2 years ago when she developed severe chest pain and dyspnea but did not seek medical care.  She continues to work full time.  She does not drink ETOH or use drugs.  She has never been pregnant.  She has significant daytime sleepiness.   Cardiac MRI was done in 8/19 showing EF 20% with preserved RV function, no LGE.    Repeat echo was done in 11/19 showing EF 30% with normal RV size and systolic function.   Echo in 8/20 showed EF 35% with normal RV size and systolic function.    Repeat cardiac MRI in 9/20 showed EF 42%, normal RV with EF 53%, no LGE.   Echo in 11/21 showed EF up to 50-55% with normal RV.  Echo 11/22 EF 55% with normal RV.  Echo in 11/23 with EF 55-60%, RV normal.  Echo was done today, on my review EF 55%, normal RV.   She presents today for annual f/u with her husband. She has not been using CPAP, getting fatigued/sleepy during the day.  Weight up 2 lbs. She has had elevated LFTs.  Per her report, this improved with stopping statin but now she has restarted statin. No dyspnea with usual activities.  Walks for exercise. No chest pain. No lightheadedness.    Labs (5/19): K 4.8, creatinine 0.65 Labs (7/19): ANA negative, transferrin saturation 11%, SPEP negative.  Labs (8/19): K 3.8, creatinine 0.77 Labs (9/19): K 4.2, creatinine 0.57 Labs (10/19): hgb 14 Labs (2/20): K 4.3, creatinine 0.57 Labs (11/20): K 4.5, creatinine 0.50 Labs (3/21): K 4.1, creatinine 0.79 Labs (6/21): K 4,  creatinine 0.64, LDL 80 Labs (11/21): K 3.9, creatinine 0.7 Labs (7/22): K 4.2, creatinine 0.51 Labs (8/23): K 3.6, creatinine 0.41  Labs (5/24): K 4, creatinine 0.48, LDL 82  PMH: 1. Fibromyalgia 2. PCOS 3. Type II diabetes 4. Chronic systolic CHF: Nonischemic cardiomyopathy.  - Echo (4/19): Severe LV dilation with EF 20-25%, restrictive diastolic function.  - Coronary CTA (5/19): CAC 0, normal coronaries.  - Cardiac MRI (8/19): moderate LV dilation with EF 20%, normal RV size and systolic function, EF 60%.  No LGE.  - CPX (9/19): RER 1.03, peak VO2 19, VE/VCO2 slope 38 => submaximal study, moderately decreased functional capacity.  - Echo (11/19): EF 30%, normal RV size and systolic function, no MR.  - Echo (8/20): EF 35%, normal RV size and systolic function.  - Cardiac MRI (9/20): LV EF 42% with mild-moderate diffuse hypokinesis, RV EF 53%, no LGE.  - Echo (11/21): EF 50-55%, normal RV, normal IVC.  - Echo (11/22): EF 55%, normal RV - Echo (11/23): EF 55-60%, normal RV - Echo (11/24): EF 55%, normal RV 5. Sinus tachycardia: Holter (5/19) with average HR 95, rare PVCs, no other arrhythmias.  6. OSA  7. Laparoscopic cholecystectomy 11/20.   SH: Rare ETOH, no smoking.  Married, no children or pregnancies.  Works full time in [employer].    FH: No  history of CAD or cardiomyopathy.  ROS: All systems reviewed and negative except as per HPI.   Current Outpatient Medications  Medication Sig Dispense Refill   Ascorbic Acid (VITAMIN C) 1000 MG tablet Take 1,000 mg by mouth daily.     atorvastatin (LIPITOR) 40 MG tablet Take 40 mg by mouth daily.      bisoprolol (ZEBETA) 10 MG tablet Take 1 tablet (10 mg total) by mouth daily. 90 tablet 3   empagliflozin (JARDIANCE) 25 MG TABS tablet Take 25 mg by mouth daily.     glimepiride (AMARYL) 2 MG tablet Take 4 mg by mouth daily with breakfast.     ivabradine (CORLANOR) 5 MG TABS tablet Take 1 tablet (5 mg total) by mouth 2 (two) times daily with  a meal. 180 tablet 3   levothyroxine (SYNTHROID, LEVOTHROID) 25 MCG tablet Take 25 mcg by mouth daily before breakfast.     metFORMIN (GLUCOPHAGE) 1000 MG tablet Take 1,000 mg by mouth 2 (two) times daily with a meal.      Multiple Vitamins-Minerals (ONE DAILY MULTIVITAMIN WOMEN PO) Take 1 tablet by mouth daily. Hair skin and nails     norethindrone (AYGESTIN) 5 MG tablet Take 5 mg by mouth daily.     sacubitril-valsartan (ENTRESTO) 49-51 MG Take 1 tablet by mouth 2 (two) times daily. 180 tablet 3   Semaglutide, 1 MG/DOSE, (OZEMPIC, 1 MG/DOSE,) 2 MG/1.5ML SOPN Inject 2 mg into the skin every 7 (seven) days.     spironolactone (ALDACTONE) 25 MG tablet Take 1 tablet (25 mg total) by mouth daily. 90 tablet 3   No current facility-administered medications for this encounter.   Exam:  BP 102/85   Pulse 89   Wt 67.1 kg (148 lb)   SpO2 98%   BMI 26.22 kg/m  General: NAD Neck: No JVD, no thyromegaly or thyroid nodule.  Lungs: Clear to auscultation bilaterally with normal respiratory effort. CV: Nondisplaced PMI.  Heart regular S1/S2, no S3/S4, no murmur.  No peripheral edema.  No carotid bruit.  Normal pedal pulses.  Abdomen: Soft, nontender, no hepatosplenomegaly, no distention.  Skin: Intact without lesions or rashes.  Neurologic: Alert and oriented x 3.  Psych: Normal affect. Extremities: No clubbing or cyanosis.  HEENT: Normal.   Assessment/Plan: 1. Chronic systolic CHF => recovered EF: Echo in 5/19 with severe LV dilation, EF 20-25%.  Coronary CT showed no evidence for CAD => nonischemic cardiomyopathy.  No prior pregnancies, so not peri-partum cardiomyopathy.  Mild sinus tachycardia, most recent holter with avg HR 95.  Do not think this is tachy-mediated CMP.  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  Cardiac MRI in 8/19 showed EF 20%, normal RV, no myocardial LGE.  CPX with moderate  functional limitation from HF.  Echo in 11/19 showed EF 30%.  Echo in 8/20 showed EF 35%, borderline for ICD.  Cardiac MRI was then done in 9/20 for more accurate quantification of LV function, and EF found to be 42%.  Echo in 11/21 with EF 50-55%, much improvement. 11/22 showed EF 55%, normal RV.  Echo today showed EF 55% with normal RV on my review.  NYHA class I-II, not volume overloaded on exam.   - I will have her try to stop ivabradine.  She can restart if HR increases to > 100 bpm or she feels worse.  - Continue Entresto 49/51 bid - Continue bisoprolol 10 mg  daily.    - Continue spironolactone 25 mg daily.   - Continue Jardiance 25 mg daily.  - BMET today.  2. OSA: She needs to restart on CPAP.  3. Hypothyroidism: on levothyroxine - followed by PCP 4. Type 2DM:  - on Jardiance + Semaglutide 5. Elevated LFTs: Possibly related to statin.  Viral hepatitis labs negative.  She is now back on statin.  - Check LFTs.  Would stop statin and refer to lipid clinic for Repatha if LFTs are elevated again.   F/u again in 1 year   Signed, Marca Ancona, MD  03/24/2023  [facility] [address] [facility] [place] [address] [phone] (office) [phone] (fax)

## 2023-03-25 ENCOUNTER — Telehealth (HOSPITAL_COMMUNITY): Payer: Self-pay

## 2023-03-25 DIAGNOSIS — I5022 Chronic systolic (congestive) heart failure: Secondary | ICD-10-CM

## 2023-03-25 LAB — ECHOCARDIOGRAM COMPLETE
Calc EF: 46.1 %
S' Lateral: 2.9 cm
Single Plane A2C EF: 52 %
Single Plane A4C EF: 42.4 %

## 2023-03-25 NOTE — Telephone Encounter (Signed)
Patient's referral to the lipid clinic has been placed. Pt aware, agreeable, and verbalized understanding.

## 2023-03-25 NOTE — Telephone Encounter (Signed)
-----   Message from Marca Ancona sent at 03/24/2023  5:04 PM EST ----- LFTs are still high.  Stop atorvastatin and refer to lipid clinic for Repatha (diabetic).

## 2023-04-29 ENCOUNTER — Encounter (HOSPITAL_COMMUNITY): Payer: Self-pay | Admitting: Cardiology

## 2023-04-30 MED ORDER — ENTRESTO 49-51 MG PO TABS: 1.0000 | ORAL_TABLET | Freq: Two times a day (BID) | ORAL | 3 refills | Status: DC

## 2023-05-15 ENCOUNTER — Telehealth (HOSPITAL_COMMUNITY): Payer: Self-pay | Admitting: Cardiology

## 2023-05-15 DIAGNOSIS — I5022 Chronic systolic (congestive) heart failure: Secondary | ICD-10-CM

## 2023-05-15 DIAGNOSIS — G4733 Obstructive sleep apnea (adult) (pediatric): Secondary | ICD-10-CM

## 2023-05-15 NOTE — Telephone Encounter (Signed)
 Patient called to request 2019 sleep study to be faxed over to [facility].  [phone])  Reports she would like to get in the habit of using CPAP machine and updated materials are needed.  Advised CPAP therapy is managed by Dr Shlomo, unsure if updated sleep study is needed-last sleep study 2019  However would forward for further review with [facility]

## 2023-05-16 ENCOUNTER — Ambulatory Visit: Payer: 59

## 2023-05-17 ENCOUNTER — Other Ambulatory Visit (HOSPITAL_COMMUNITY): Payer: Self-pay | Admitting: Cardiology

## 2023-05-23 NOTE — Telephone Encounter (Signed)
 Reached out to patient and informed her her sleep study has been faxed over to her dme [facility].  Upon patient request DME selection is [facility]. Patient understands he will be contacted by [facility] to set up his cpap. Patient understands to call if [facility] does not contact him with new setup in a timely manner. Patient understands they will be called once confirmation has been received from [facility] that they have received their new machine to schedule 10 week follow up appointment.   [facility] notified of new cpap order  Please add to airview Patient was grateful for the call and thanked me.

## 2023-06-05 NOTE — Addendum Note (Signed)
Addended by: Reesa Chew on: 06/05/2023 05:29 PM   Modules accepted: Orders

## 2023-06-05 NOTE — Telephone Encounter (Signed)
Order placed for cpap supplies to [facility].

## 2024-01-05 ENCOUNTER — Other Ambulatory Visit (HOSPITAL_COMMUNITY): Payer: Self-pay | Admitting: Cardiology

## 2024-01-13 ENCOUNTER — Other Ambulatory Visit (HOSPITAL_COMMUNITY): Payer: Self-pay | Admitting: Cardiology

## 2024-03-10 ENCOUNTER — Other Ambulatory Visit (HOSPITAL_COMMUNITY): Payer: Self-pay | Admitting: Cardiology

## 2024-03-17 ENCOUNTER — Other Ambulatory Visit (HOSPITAL_COMMUNITY): Payer: Self-pay

## 2024-03-17 ENCOUNTER — Telehealth (HOSPITAL_COMMUNITY): Payer: Self-pay

## 2024-03-17 NOTE — Telephone Encounter (Signed)
 Advanced Heart Failure Patient Advocate Encounter  Prior authorization for Ivabradine  has been submitted and approved. Test billing returns $15 for 30 day supply. Insurance limit 30 day fill.  Key: [id number] Effective: 02/16/2024 to 03/17/2025  Rachel DEL, CPhT Rx Patient Advocate Phone: [phone]

## 2024-05-23 ENCOUNTER — Other Ambulatory Visit (HOSPITAL_COMMUNITY): Payer: Self-pay | Admitting: Internal Medicine

## 2024-05-24 ENCOUNTER — Other Ambulatory Visit (HOSPITAL_COMMUNITY): Payer: Self-pay
